# Patient Record
Sex: Male | Born: 1960 | Race: White | Hispanic: No | Marital: Single | Smoking: Current every day smoker
Health system: Southern US, Community
[De-identification: ages and names within clinical notes are randomized; demographics above are authoritative.]

## PROBLEM LIST (undated history)

## (undated) DIAGNOSIS — T79A22A Traumatic compartment syndrome of left lower extremity, initial encounter: Secondary | ICD-10-CM

## (undated) DIAGNOSIS — I80209 Phlebitis and thrombophlebitis of unspecified deep vessels of unspecified lower extremity: Secondary | ICD-10-CM

## (undated) HISTORY — PX: FASCIOTOMY: SHX132

## (undated) HISTORY — PX: TONSILLECTOMY: SUR1361

## (undated) HISTORY — PX: FOOT SURGERY: SHX648

---

## 2010-11-21 ENCOUNTER — Ambulatory Visit: Admit: 2010-11-21 | Payer: Self-pay | Admitting: Family

## 2010-11-21 DIAGNOSIS — Z0289 Encounter for other administrative examinations: Secondary | ICD-10-CM

## 2013-05-31 ENCOUNTER — Emergency Department (HOSPITAL_BASED_OUTPATIENT_CLINIC_OR_DEPARTMENT_OTHER): Payer: Medicare Other

## 2013-05-31 ENCOUNTER — Inpatient Hospital Stay (HOSPITAL_BASED_OUTPATIENT_CLINIC_OR_DEPARTMENT_OTHER)
Admission: EM | Admit: 2013-05-31 | Discharge: 2013-06-04 | DRG: 504 | Disposition: A | Discharge status: Home / Self Care | Payer: Medicare Other | Attending: Internal Medicine | Admitting: Internal Medicine

## 2013-05-31 ENCOUNTER — Encounter (HOSPITAL_BASED_OUTPATIENT_CLINIC_OR_DEPARTMENT_OTHER): Payer: Self-pay | Admitting: Emergency Medicine

## 2013-05-31 DIAGNOSIS — L03119 Cellulitis of unspecified part of limb: Secondary | ICD-10-CM | POA: Diagnosis present

## 2013-05-31 DIAGNOSIS — D72829 Elevated white blood cell count, unspecified: Secondary | ICD-10-CM | POA: Diagnosis present

## 2013-05-31 DIAGNOSIS — G5790 Unspecified mononeuropathy of unspecified lower limb: Secondary | ICD-10-CM | POA: Diagnosis present

## 2013-05-31 DIAGNOSIS — D72819 Decreased white blood cell count, unspecified: Secondary | ICD-10-CM

## 2013-05-31 DIAGNOSIS — M205X9 Other deformities of toe(s) (acquired), unspecified foot: Secondary | ICD-10-CM | POA: Diagnosis present

## 2013-05-31 DIAGNOSIS — L84 Corns and callosities: Secondary | ICD-10-CM | POA: Diagnosis present

## 2013-05-31 DIAGNOSIS — M86172 Other acute osteomyelitis, left ankle and foot: Secondary | ICD-10-CM | POA: Diagnosis present

## 2013-05-31 DIAGNOSIS — L97509 Non-pressure chronic ulcer of other part of unspecified foot with unspecified severity: Secondary | ICD-10-CM | POA: Diagnosis present

## 2013-05-31 DIAGNOSIS — D473 Essential (hemorrhagic) thrombocythemia: Secondary | ICD-10-CM | POA: Diagnosis present

## 2013-05-31 DIAGNOSIS — L02619 Cutaneous abscess of unspecified foot: Secondary | ICD-10-CM | POA: Diagnosis present

## 2013-05-31 DIAGNOSIS — Z79899 Other long term (current) drug therapy: Secondary | ICD-10-CM

## 2013-05-31 DIAGNOSIS — Z86718 Personal history of other venous thrombosis and embolism: Secondary | ICD-10-CM

## 2013-05-31 DIAGNOSIS — M86179 Other acute osteomyelitis, unspecified ankle and foot: Principal | ICD-10-CM | POA: Diagnosis present

## 2013-05-31 DIAGNOSIS — F17200 Nicotine dependence, unspecified, uncomplicated: Secondary | ICD-10-CM | POA: Diagnosis present

## 2013-05-31 DIAGNOSIS — D75839 Thrombocytosis, unspecified: Secondary | ICD-10-CM

## 2013-05-31 HISTORY — DX: Traumatic compartment syndrome of left lower extremity, initial encounter: T79.A22A

## 2013-05-31 HISTORY — DX: Phlebitis and thrombophlebitis of unspecified deep vessels of unspecified lower extremity: I80.209

## 2013-05-31 LAB — CBC WITH DIFFERENTIAL/PLATELET
Basophils Relative: 0 % (ref 0–1)
Eosinophils Absolute: 0.4 10*3/uL (ref 0.0–0.7)
Eosinophils Relative: 2 % (ref 0–5)
Hemoglobin: 13.2 g/dL (ref 13.0–17.0)
Lymphs Abs: 3.5 10*3/uL (ref 0.7–4.0)
MCH: 28.5 pg (ref 26.0–34.0)
MCHC: 33.3 g/dL (ref 30.0–36.0)
MCV: 85.5 fL (ref 78.0–100.0)
Monocytes Absolute: 1.2 10*3/uL — ABNORMAL HIGH (ref 0.1–1.0)
Monocytes Relative: 7 % (ref 3–12)
Neutrophils Relative %: 68 % (ref 43–77)
RBC: 4.63 MIL/uL (ref 4.22–5.81)

## 2013-05-31 LAB — BASIC METABOLIC PANEL
BUN: 18 mg/dL (ref 6–23)
Calcium: 9.1 mg/dL (ref 8.4–10.5)
Creatinine, Ser: 1 mg/dL (ref 0.50–1.35)
GFR calc Af Amer: >90 mL/min (ref >90)
GFR calc non Af Amer: 86 mL/min — ABNORMAL LOW (ref >90)
Glucose, Bld: 88 mg/dL (ref 70–99)
Potassium: 4.6 mEq/L (ref 3.5–5.1)

## 2013-05-31 MED ORDER — SODIUM CHLORIDE 0.9 % IV SOLN: INTRAVENOUS | Status: DC

## 2013-05-31 MED ORDER — HYDROCODONE-ACETAMINOPHEN 5-325 MG PO TABS
1.0000 | ORAL_TABLET | Freq: Once | ORAL | Status: AC
  Administered 2013-05-31: 1 via ORAL
  Filled 2013-05-31: qty 1

## 2013-05-31 MED ORDER — SODIUM CHLORIDE 0.9 % IV SOLN
INTRAVENOUS | Status: DC
  Administered 2013-06-01: 1000 mL via INTRAVENOUS
  Administered 2013-06-02: 75 mL/h via INTRAVENOUS
  Administered 2013-06-02: 05:00:00 via INTRAVENOUS
  Administered 2013-06-03: 1000 mL via INTRAVENOUS
  Administered 2013-06-04: 01:00:00 via INTRAVENOUS

## 2013-05-31 MED ORDER — HYDROMORPHONE HCL PF 1 MG/ML IJ SOLN
1.0000 mg | INTRAMUSCULAR | Status: AC | PRN
  Administered 2013-05-31 – 2013-06-01 (×6): 1 mg via INTRAVENOUS
  Filled 2013-05-31 (×6): qty 1

## 2013-05-31 MED ORDER — SODIUM CHLORIDE 0.9 % IV BOLUS (SEPSIS)
1000.0000 mL | Freq: Once | INTRAVENOUS | Status: AC
  Administered 2013-05-31: 1000 mL via INTRAVENOUS

## 2013-05-31 MED ORDER — HYDROMORPHONE HCL PF 1 MG/ML IJ SOLN
1.0000 mg | Freq: Once | INTRAMUSCULAR | Status: AC
  Administered 2013-05-31: 1 mg via INTRAVENOUS

## 2013-05-31 MED ORDER — HYDROMORPHONE HCL PF 1 MG/ML IJ SOLN: 1.0000 mg | INTRAMUSCULAR | Status: DC | PRN

## 2013-05-31 MED ORDER — VANCOMYCIN HCL IN DEXTROSE 1-5 GM/200ML-% IV SOLN
1000.0000 mg | Freq: Once | INTRAVENOUS | Status: AC
  Administered 2013-05-31: 1000 mg via INTRAVENOUS
  Filled 2013-05-31: qty 200

## 2013-05-31 MED ORDER — VANCOMYCIN HCL IN DEXTROSE 1-5 GM/200ML-% IV SOLN
1000.0000 mg | Freq: Three times a day (TID) | INTRAVENOUS | Status: DC
  Administered 2013-05-31 – 2013-06-03 (×9): 1000 mg via INTRAVENOUS
  Filled 2013-05-31 (×9): qty 200

## 2013-05-31 MED ORDER — HYDROMORPHONE HCL PF 1 MG/ML IJ SOLN
INTRAMUSCULAR | Status: AC
  Filled 2013-05-31: qty 1

## 2013-05-31 MED ORDER — PNEUMOCOCCAL VAC POLYVALENT 25 MCG/0.5ML IJ INJ
0.5000 mL | INJECTION | INTRAMUSCULAR | Status: AC
  Administered 2013-06-01: 0.5 mL via INTRAMUSCULAR
  Filled 2013-05-31 (×2): qty 0.5

## 2013-05-31 MED ORDER — HYDROMORPHONE HCL PF 1 MG/ML IJ SOLN
1.0000 mg | Freq: Once | INTRAMUSCULAR | Status: AC
  Administered 2013-05-31: 1 mg via INTRAVENOUS
  Filled 2013-05-31: qty 1

## 2013-05-31 NOTE — Progress Notes (Signed)
ANTIBIOTIC CONSULT NOTE - INITIAL  Pharmacy Consult for vancomycin Indication: osteomyelitis of left foot    No Known Allergies  Patient Measurements: Height: 6\' 2"  (188 cm) Weight: 198 lb 6.6 oz (90 kg) IBW/kg (Calculated) : 82.2 Adjusted Body Weight:   Vital Signs: Temp: 98.2 F (36.8 C) (07/27 1950) Temp src: Oral (07/27 1950) BP: 131/67 mmHg (07/27 1950) Pulse Rate: 84 (07/27 1950) Intake/Output from previous day:   Intake/Output from this shift:    Labs:  Recent Labs  05/31/13 1500  WBC 15.9*  HGB 13.2  PLT 455*  CREATININE 1.00   Estimated Creatinine Clearance: 102.8 ml/min (by C-G formula based on Cr of 1). No results found for this basename: VANCOTROUGH, VANCOPEAK, VANCORANDOM, GENTTROUGH, GENTPEAK, GENTRANDOM, TOBRATROUGH, TOBRAPEAK, TOBRARND, AMIKACINPEAK, AMIKACINTROU, AMIKACIN,  in the last 72 hours   Microbiology: No results found for this or any previous visit (from the past 720 hour(s)).  Medical History: Past Medical History  Diagnosis Date  . Phlegmasia cerulea dolens   . Compartment syndrome of left lower extremity     Medications:  Anti-infectives   Start     Dose/Rate Route Frequency Ordered Stop   05/31/13 2215  vancomycin (VANCOCIN) IVPB 1000 mg/200 mL premix     1,000 mg 200 mL/hr over 60 Minutes Intravenous 3 times per day 05/31/13 2201     05/31/13 1445  vancomycin (VANCOCIN) IVPB 1000 mg/200 mL premix     1,000 mg 200 mL/hr over 60 Minutes Intravenous  Once 05/31/13 1443 05/31/13 1618     Assessment: Patient with osteomyelitis of left foot.  First dose of antibiotics already given at ~1500.  Goal of Therapy:  Vancomycin trough level 15-20 mcg/ml  Plan:  Measure antibiotic drug levels at steady state Follow up culture results Vancomycin 1gm iv q8hr  Scott Frye 05/31/2013,10:03 PM

## 2013-05-31 NOTE — ED Notes (Signed)
[facility] here for transport.  

## 2013-05-31 NOTE — ED Notes (Signed)
Patient transported to X-ray 

## 2013-05-31 NOTE — Consult Note (Signed)
Reason for Consult: Left foot non-healing ulcer Referring Physician: Dr. Ina Kick is an 52 y.o. male.  HPI: Scott Frye is a 52 yo male with a several year history of problems related to his left foot. It all began with a blood clot in his left calf which led to severe swelling in the leg and development of compartment syndrome requiring fasciotomy. He developed clawing in all lesser toes and has had blood blisters on the dorsum of the 2nd and third toes on a few occasions. He also developed an ulcer under the 5th metatarsal head which was treated with Irrigation and debridement by Dr. Yetta Barre in [place]. Scott Frye states that the wound did not heal and that he had 6 weeks of IV antibiotics as well as a VAC. He has had a persistent non-healing ulcer in this area with increasing drainage and pain recently. 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 He presented to the [facility] today and evaluation showed radiographs consistent with osteomyelitis of the 5th metatarsal. He is subsequently admitted here for treatment and I was consulted for possible surgical intervention.  History reviewed. No pertinent past medical history.  Past Surgical History  Procedure Laterality Date  . Foot surgery    . Fasciotomy      left leg  . Tonsillectomy      No family history on file.  Social History:  reports that he has been smoking.  He does not have any smokeless tobacco history on file. He reports that he does not drink alcohol or use illicit drugs.  Allergies: No Known Allergies  Medications: I have reviewed the patient's current medications.  Results for orders placed during the hospital encounter of 05/31/13 (from the past 48 hour(s))  CBC WITH DIFFERENTIAL     Status: Abnormal   Collection Time    05/31/13  3:00 PM      Result Value Range   WBC 15.9  (*) 4.0 - 10.5 K/uL   RBC 4.63  4.22 - 5.81 MIL/uL   Hemoglobin 13.2  13.0 - 17.0 g/dL   HCT 64.4  03.4 - 74.2 %   MCV 85.5  78.0 - 100.0 fL   MCH 28.5  26.0 - 34.0 pg   MCHC 33.3  30.0 - 36.0 g/dL   RDW 59.5  63.8 - 75.6 %   Platelets 455 (*) 150 - 400 K/uL   Neutrophils Relative % 68  43 - 77 %   Neutro Abs 10.8 (*) 1.7 - 7.7 K/uL   Lymphocytes Relative 22  12 - 46 %   Lymphs Abs 3.5  0.7 - 4.0 K/uL   Monocytes Relative 7  3 - 12 %   Monocytes Absolute 1.2 (*) 0.1 - 1.0 K/uL   Eosinophils Relative 2  0 - 5 %   Eosinophils Absolute 0.4  0.0 - 0.7 K/uL   Basophils Relative 0  0 - 1 %   Basophils Absolute 0.0  0.0 - 0.1 K/uL  BASIC METABOLIC PANEL     Status: Abnormal   Collection Time    05/31/13  3:00 PM      Result Value Range   Sodium 137  135 - 145 mEq/L   Potassium 4.6  3.5 - 5.1 mEq/L   Chloride 101  96 - 112 mEq/L   CO2 25  19 - 32  mEq/L   Glucose, Bld 88  70 - 99 mg/dL   BUN 18  6 - 23 mg/dL   Creatinine, Ser 4.54  0.50 - 1.35 mg/dL   Calcium 9.1  8.4 - 09.8 mg/dL   GFR calc non Af Amer 86 (*) >90 mL/min   GFR calc Af Amer >90  >90 mL/min   Comment:            The eGFR has been calculated     using the CKD EPI equation.     This calculation has not been     validated in all clinical     situations.     eGFR's persistently     <90 mL/min signify     possible Chronic Kidney Disease.    Dg Foot Complete Left  05/31/2013   *RADIOLOGY REPORT*  Clinical Data: Nonhealing wound in the lateral aspect of the left foot and beneath the base of the fifth toe.  LEFT FOOT - COMPLETE 3+ VIEW  Comparison: No priors.  Findings: There is soft tissue gas within the plantar aspect of the foot beneath the fifth MTP joint.  There is osteolysis of the head of the fifth metatarsal, and there is dorsal dislocation of the digit beyond this level.  No acute displaced fracture is noted. Degenerative changes of osteoarthritis are noted, most severe at the first MTP joint.  IMPRESSION: 1.  The  appearance of the foot is compatible with osteomyelitis of the head of the fifth metatarsal with overlying soft tissue ulcer and dislocation of the fifth MTP joint, as above.   Original Report Authenticated By: Trudie Reed, M.D.    Review of Systems  Constitutional: Negative.   HENT: Negative.   Eyes: Negative.   Respiratory: Negative.   Cardiovascular: Negative.   Gastrointestinal: Negative.   Genitourinary: Negative.   Musculoskeletal: Negative.   Skin:       Chronic ulceration left 5th toe at lateral base of toe  Endo/Heme/Allergies: Negative.   Psychiatric/Behavioral: Negative.    Blood pressure 131/67, pulse 84, temperature 98.2 F (36.8 C), temperature source Oral, resp. rate 18, height 6\' 2"  (1.88 m), weight 198 lb 6.6 oz (90 kg), SpO2 98.00%. 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 Has scant amount of malodorous drainage. No surrounding erythema   Assessment/Plan: Left foot non-healing ulcer with osteomyelitis- This has been a chronic non-healing ulcer with chronic destructive changes of the 5th metatarsal head. Due to his claw toes he has added pressure on the metatarsal heads which has led to chronic wound breakdown and now osteomyelitis. With his worsening pain and drainage, I would recommend formal Irrigation and Debridement with partial amputation of the 5th metatarsal to allow for primary wound closure. I have  discussed this in detail with the patient and his wife and they would like to proceed in surgical intervention. Will proceed tomorrow afternoon.  Loanne Drilling 05/31/2013, 8:32 PM

## 2013-05-31 NOTE — ED Notes (Signed)
MD at bedside. 

## 2013-05-31 NOTE — ED Notes (Signed)
Pt reports left lateral and heel foot pain x 3 days that progressively become worse. Sts has appt with foot doctor on Aug 4th.

## 2013-05-31 NOTE — H&P (Signed)
[facility] History and Physical  Scott Frye ZOX:[id number] DOB: 23-Aug-1961 DOA: 05/31/2013  Referring physician: ED PCP: No primary provider on file.  Chief Complaint: Left foot pain and ulcer  HPI: Scott Frye is a 52 y.o. male who presents to to the ED at [facility] with c/o L foot chronic ulcer that has become significantly more painful recently.  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  He developed clawwing in all of his lesser toes.  He states that the wound did not heal on his foot and he had 6 weeks of ABx as well as a wound VAC at that time.  He has had a persistent non-healing ulcer on the plantar surface of his foot since that time with increasing drainage and pain recently.  In the ED at [facility] he was running a WBC of 15k, X ray of his foot was worrisome for osteomyelitis.  He was transferred to [facility] for admission and treatment.  Review of Systems: No chest pain, no SOB, no DOE, exercises frequently and is in quite good shape, 12 systems reviewed and otherwise negative.  History reviewed. No pertinent past medical history. Past Surgical History  Procedure Laterality Date  . Foot surgery    . Fasciotomy      left leg  . Tonsillectomy     Social History:  reports that he has been smoking.  He does not have any smokeless tobacco history on file. He reports that he does not drink alcohol or use illicit drugs.   No Known Allergies  No family history on file.  Prior to Admission medications   Medication Sig Start Date End Date Taking? Authorizing Provider  acetaminophen (TYLENOL) 500 MG tablet Take 1,000 mg by mouth every 6 (six) hours as needed for pain.   Yes Historical Provider, MD  gabapentin (NEURONTIN) 600 MG tablet Take 1,800 mg by mouth 3 (three) times daily as needed (pain).   Yes Historical Provider, MD  Multiple  Vitamin (MULTIVITAMIN WITH MINERALS) TABS Take 1 tablet by mouth every morning.   Yes Historical Provider, MD   Physical Exam: Filed Vitals:   05/31/13 1605 05/31/13 1725 05/31/13 1754 05/31/13 1950  BP: 116/70  126/63 131/67  Pulse: 84  92 84  Temp:  98.1 F (36.7 C)  98.2 F (36.8 C)  TempSrc:  Oral  Oral  Resp: 16   18  Height:    6\' 2"  (1.88 m)  Weight:    90 kg (198 lb 6.6 oz)  SpO2: 100%  98% 98%    General:  NAD, resting comfortably in bed Eyes: PEERLA EOMI ENT: mucous membranes moist Neck: supple w/o JVD Cardiovascular: RRR w/o MRG Respiratory: CTA B Abdomen: soft, nt, nd, bs+ Skin: chronic ulceration of the left 5th toe and lateral base of toe, clawing of all toes of L foot Musculoskeletal: MAE, full ROM all 4 extremities Psychiatric: normal tone and affect Neurologic: AAOx3, grossly non-focal  Labs on Admission:  Basic Metabolic Panel:  Recent Labs Lab 05/31/13 1500  NA 137  K 4.6  CL 101  CO2 25  GLUCOSE 88  BUN 18  CREATININE 1.00  CALCIUM 9.1   Liver Function Tests: No results found for this basename: AST, ALT, ALKPHOS, BILITOT, PROT, ALBUMIN,  in the last 168 hours No results found for this basename: LIPASE, AMYLASE,  in the last  168 hours No results found for this basename: AMMONIA,  in the last 168 hours CBC:  Recent Labs Lab 05/31/13 1500  WBC 15.9*  NEUTROABS 10.8*  HGB 13.2  HCT 39.6  MCV 85.5  PLT 455*   Cardiac Enzymes: No results found for this basename: CKTOTAL, CKMB, CKMBINDEX, TROPONINI,  in the last 168 hours  BNP (last 3 results) No results found for this basename: PROBNP,  in the last 8760 hours CBG: No results found for this basename: GLUCAP,  in the last 168 hours  Radiological Exams on Admission: Dg Foot Complete Left  05/31/2013   *RADIOLOGY REPORT*  Clinical Data: Nonhealing wound in the lateral aspect of the left foot and beneath the base of the fifth toe.  LEFT FOOT - COMPLETE 3+ VIEW  Comparison: No priors.   Findings: There is soft tissue gas within the plantar aspect of the foot beneath the fifth MTP joint.  There is osteolysis of the head of the fifth metatarsal, and there is dorsal dislocation of the digit beyond this level.  No acute displaced fracture is noted. Degenerative changes of osteoarthritis are noted, most severe at the first MTP joint.  IMPRESSION: 1.  The appearance of the foot is compatible with osteomyelitis of the head of the fifth metatarsal with overlying soft tissue ulcer and dislocation of the fifth MTP joint, as above.   Original Report Authenticated By: Trudie Reed, M.D.    EKG: Independently reviewed.  Assessment/Plan Principal Problem:   Acute osteomyelitis of left foot   1.  Acute osteomyelitis of left foot - secondary to non-healing ulcer from his bout of phlegmasia cerulea dolens occuring several years back, continuing vancomycin on patient, surgery planned for tomorrow, final ABx recommendations based on bone culture findings.  Patient NPO after midnight for surgery, keeping him on 75 cc/hr of NS to ensure he stays hydrated.  Dr. Lequita Halt planning surgery tomorrow, please see his note.  Code Status: Full Code (must indicate code status--if unknown or must be presumed, indicate so) Family Communication: Spoke with wife at bedside (indicate person spoken with, if applicable, with phone number if by telephone) Disposition Plan: Admit to inpatient (indicate anticipated LOS)  Time spent: 70 min  Magdiel Bartles M. [facility] Pager [phone]  If 7PM-7AM, please contact night-coverage [email or web address] Password [email or web address] 05/31/2013, 9:14 PM

## 2013-05-31 NOTE — ED Provider Notes (Addendum)
CSN: [id number]     Arrival date & time 05/31/13  1344 History     First MD Initiated Contact with Patient 05/31/13 1346     Chief Complaint  Patient presents with  . Foot Pain   (Consider location/radiation/quality/duration/timing/severity/associated sxs/prior Treatment) HPI  The patient presents with concern of left foot pain.  This episode began 3 days ago.  Patient's notable history of left distal lower extremity DVT that required fasciotomy 3 years ago.  Since that time he has had chronic pain in the left distal extremity, as well as a nonhealing ulcer on the inferior aspect of his fifth digit.  He states that this new pain it is distinct from his typical pain.  The pain is severe, sharp, worse with angulation or weight-bearing.  Pain is not improved with Neurontin.  There is no new discharge, fever, chills, other focal complaints.    History reviewed. No pertinent past medical history. Past Surgical History  Procedure Laterality Date  . Foot surgery    . Fasciotomy      left leg  . Tonsillectomy     No family history on file. History  Substance Use Topics  . Smoking status: Current Every Day Smoker  . Smokeless tobacco: Not on file  . Alcohol Use: No    Review of Systems  All other systems reviewed and are negative.    Allergies  Review of patient's allergies indicates no known allergies.  Home Medications  No current outpatient prescriptions on file. BP 145/84  Pulse 93  Resp 18  SpO2 99% Physical Exam  Nursing note and vitals reviewed. Constitutional: He is oriented to person, place, and time. He appears well-developed. No distress.  HENT:  Head: Normocephalic and atraumatic.  Eyes: Conjunctivae and EOM are normal.  Cardiovascular: Normal rate, regular rhythm and intact distal pulses.   Pulmonary/Chest: Effort normal. No stridor. No respiratory distress.  Abdominal: He exhibits no distension.  Musculoskeletal: He exhibits no edema.  Patient has a  fasciotomy scar on the lateral aspect of the midcalf.  Distally, the patient is no tenderness or deformity about the ankle.  There is tenderness to palpation about the distal inferior he'll without deformity. The status of the left foot are all curled, held in flexion. At the base of the proximal phalanx of the fifth digit there is a nonhealing wound approximately 1 cm diameter with surrounding inflammatory tissue, but no discharge, no bleeding, no necrosis. This area is tender to palpation. Patient can flex and extend all toes minimally, states that this is unchanged from his new baseline.   Neurological: He is alert and oriented to person, place, and time.  Skin: Skin is warm and dry.  Psychiatric: He has a normal mood and affect.    ED Course   Procedures (including critical care time)  Labs Reviewed - No data to display No results found. No diagnosis found.   2:49 PM On repeat exam the patient appears comfortable, but describes ongoing pain in the foot.  IV fluids are started, analgesia started. I have interpreted the x-ray, results is inconsistent with physical exam.  On repeat exam the toe is misaligned, and the patient states that it has been this way for some time MDM  This generally well patient presents with ongoing concerns of left lateral foot pain.  Notably, his history of prior DVT, with subsequent increase in pain, decrease in functionality is concerning.  Patient does have a nonhealing ulcer, and given this with his description of  the pain x-ray was performed to rule out osteomyelitis.  This was demonstrated on x-ray.  The patient required admission following initiation of antibiotics for further evaluation and management. The patient's misaligned toe seems chronic, and also will require further evaluation and management by our orthopedist.   Gerhard Munch, MD 05/31/13 1451  2:58 PM Dr. Lequita Halt will consult on the patient's case.  The patient will be transferred to  [facility].   Gerhard Munch, MD 05/31/13 1459

## 2013-06-01 ENCOUNTER — Encounter (HOSPITAL_COMMUNITY): Payer: Self-pay | Admitting: Anesthesiology

## 2013-06-01 ENCOUNTER — Encounter (HOSPITAL_COMMUNITY)
Admission: EM | Disposition: A | Discharge status: Home / Self Care | Payer: Self-pay | Source: Home / Self Care | Attending: Internal Medicine

## 2013-06-01 ENCOUNTER — Inpatient Hospital Stay (HOSPITAL_COMMUNITY): Payer: Medicare Other | Admitting: Anesthesiology

## 2013-06-01 HISTORY — PX: AMPUTATION: SHX166

## 2013-06-01 LAB — BASIC METABOLIC PANEL
BUN: 16 mg/dL (ref 6–23)
Chloride: 102 mEq/L (ref 96–112)
Creatinine, Ser: 0.91 mg/dL (ref 0.50–1.35)
GFR calc Af Amer: >90 mL/min (ref >90)
GFR calc non Af Amer: >90 mL/min (ref >90)

## 2013-06-01 LAB — CBC
HCT: 39.8 % (ref 39.0–52.0)
MCHC: 32.7 g/dL (ref 30.0–36.0)
MCV: 85.6 fL (ref 78.0–100.0)
Platelets: 426 10*3/uL — ABNORMAL HIGH (ref 150–400)
RDW: 14.3 % (ref 11.5–15.5)
WBC: 14.2 10*3/uL — ABNORMAL HIGH (ref 4.0–10.5)

## 2013-06-01 LAB — SURGICAL PCR SCREEN: MRSA, PCR: NEGATIVE

## 2013-06-01 SURGERY — AMPUTATION DIGIT
Anesthesia: General | Site: Foot | Laterality: Left | Wound class: Clean Contaminated

## 2013-06-01 MED ORDER — GABAPENTIN 600 MG PO TABS
1800.0000 mg | ORAL_TABLET | Freq: Three times a day (TID) | ORAL | Status: DC | PRN
  Filled 2013-06-01: qty 3

## 2013-06-01 MED ORDER — METOCLOPRAMIDE HCL 5 MG/ML IJ SOLN: 5.0000 mg | Freq: Three times a day (TID) | INTRAMUSCULAR | Status: DC | PRN

## 2013-06-01 MED ORDER — OXYCODONE HCL 5 MG/5ML PO SOLN
5.0000 mg | Freq: Once | ORAL | Status: DC | PRN
  Filled 2013-06-01: qty 5

## 2013-06-01 MED ORDER — MEPERIDINE HCL 50 MG/ML IJ SOLN: 6.2500 mg | INTRAMUSCULAR | Status: DC | PRN

## 2013-06-01 MED ORDER — HYDROMORPHONE HCL PF 1 MG/ML IJ SOLN
INTRAMUSCULAR | Status: DC | PRN
  Administered 2013-06-01 (×2): 1 mg via INTRAVENOUS

## 2013-06-01 MED ORDER — PROMETHAZINE HCL 25 MG/ML IJ SOLN: 6.2500 mg | INTRAMUSCULAR | Status: DC | PRN

## 2013-06-01 MED ORDER — ONDANSETRON HCL 4 MG PO TABS: 4.0000 mg | ORAL_TABLET | Freq: Four times a day (QID) | ORAL | Status: DC | PRN

## 2013-06-01 MED ORDER — ONDANSETRON HCL 4 MG/2ML IJ SOLN
INTRAMUSCULAR | Status: DC | PRN
  Administered 2013-06-01: 4 mg via INTRAVENOUS

## 2013-06-01 MED ORDER — METHOCARBAMOL 500 MG PO TABS
500.0000 mg | ORAL_TABLET | Freq: Four times a day (QID) | ORAL | Status: DC | PRN
  Administered 2013-06-02 (×2): 500 mg via ORAL
  Filled 2013-06-01 (×2): qty 1

## 2013-06-01 MED ORDER — ADULT MULTIVITAMIN W/MINERALS CH
1.0000 | ORAL_TABLET | Freq: Every morning | ORAL | Status: DC
  Administered 2013-06-01: 1 via ORAL
  Filled 2013-06-01 (×2): qty 1

## 2013-06-01 MED ORDER — ENOXAPARIN SODIUM 40 MG/0.4ML ~~LOC~~ SOLN
40.0000 mg | SUBCUTANEOUS | Status: DC
  Administered 2013-06-02 – 2013-06-03 (×2): 40 mg via SUBCUTANEOUS
  Filled 2013-06-01 (×4): qty 0.4

## 2013-06-01 MED ORDER — METHOCARBAMOL 100 MG/ML IJ SOLN
500.0000 mg | Freq: Four times a day (QID) | INTRAVENOUS | Status: DC | PRN
  Administered 2013-06-01: 500 mg via INTRAVENOUS
  Filled 2013-06-01: qty 5

## 2013-06-01 MED ORDER — EPHEDRINE SULFATE 50 MG/ML IJ SOLN
INTRAMUSCULAR | Status: DC | PRN
  Administered 2013-06-01: 5 mg via INTRAVENOUS

## 2013-06-01 MED ORDER — LACTATED RINGERS IV SOLN
INTRAVENOUS | Status: DC | PRN
  Administered 2013-06-01 (×2): via INTRAVENOUS
  Administered 2013-06-01: 17:00:00

## 2013-06-01 MED ORDER — KETOROLAC TROMETHAMINE 30 MG/ML IJ SOLN
INTRAMUSCULAR | Status: AC
  Filled 2013-06-01: qty 1

## 2013-06-01 MED ORDER — FENTANYL CITRATE 0.05 MG/ML IJ SOLN
INTRAMUSCULAR | Status: DC | PRN
  Administered 2013-06-01 (×2): 50 ug via INTRAVENOUS

## 2013-06-01 MED ORDER — HYDROMORPHONE HCL PF 1 MG/ML IJ SOLN
1.0000 mg | Freq: Once | INTRAMUSCULAR | Status: AC
  Administered 2013-06-01: 1 mg via INTRAVENOUS
  Filled 2013-06-01: qty 1

## 2013-06-01 MED ORDER — ACETAMINOPHEN 10 MG/ML IV SOLN
1000.0000 mg | Freq: Once | INTRAVENOUS | Status: AC | PRN
  Administered 2013-06-01: 1000 mg via INTRAVENOUS
  Filled 2013-06-01: qty 100

## 2013-06-01 MED ORDER — LOPERAMIDE HCL 2 MG PO CAPS: 4.0000 mg | ORAL_CAPSULE | Freq: Once | ORAL | Status: DC

## 2013-06-01 MED ORDER — PROPOFOL 10 MG/ML IV BOLUS
INTRAVENOUS | Status: DC | PRN
  Administered 2013-06-01: 50 mg via INTRAVENOUS
  Administered 2013-06-01: 200 mg via INTRAVENOUS

## 2013-06-01 MED ORDER — HYDROMORPHONE HCL PF 1 MG/ML IJ SOLN
INTRAMUSCULAR | Status: AC
  Administered 2013-06-02: 1 mg via INTRAVENOUS
  Filled 2013-06-01: qty 1

## 2013-06-01 MED ORDER — HYDROMORPHONE HCL PF 1 MG/ML IJ SOLN
0.2500 mg | INTRAMUSCULAR | Status: DC | PRN
  Administered 2013-06-01 (×4): 0.5 mg via INTRAVENOUS

## 2013-06-01 MED ORDER — OXYCODONE HCL 5 MG PO TABS
5.0000 mg | ORAL_TABLET | ORAL | Status: DC | PRN
  Administered 2013-06-01 – 2013-06-02 (×5): 5 mg via ORAL
  Filled 2013-06-01 (×5): qty 1

## 2013-06-01 MED ORDER — KETOROLAC TROMETHAMINE 30 MG/ML IJ SOLN
30.0000 mg | Freq: Once | INTRAMUSCULAR | Status: AC
  Administered 2013-06-01: 30 mg via INTRAVENOUS

## 2013-06-01 MED ORDER — ONDANSETRON HCL 4 MG/2ML IJ SOLN: 4.0000 mg | Freq: Four times a day (QID) | INTRAMUSCULAR | Status: DC | PRN

## 2013-06-01 MED ORDER — HYDROMORPHONE HCL PF 1 MG/ML IJ SOLN
0.5000 mg | INTRAMUSCULAR | Status: DC | PRN
  Administered 2013-06-01: 0.5 mg via INTRAVENOUS

## 2013-06-01 MED ORDER — MIDAZOLAM HCL 5 MG/5ML IJ SOLN
INTRAMUSCULAR | Status: DC | PRN
  Administered 2013-06-01: 2 mg via INTRAVENOUS

## 2013-06-01 MED ORDER — METOCLOPRAMIDE HCL 10 MG PO TABS: 5.0000 mg | ORAL_TABLET | Freq: Three times a day (TID) | ORAL | Status: DC | PRN

## 2013-06-01 MED ORDER — PHENYLEPHRINE HCL 10 MG/ML IJ SOLN
INTRAMUSCULAR | Status: DC | PRN
  Administered 2013-06-01: 40 ug via INTRAVENOUS
  Administered 2013-06-01: 80 ug via INTRAVENOUS
  Administered 2013-06-01: 40 ug via INTRAVENOUS

## 2013-06-01 MED ORDER — LIDOCAINE HCL 1 % IJ SOLN
INTRAMUSCULAR | Status: DC | PRN
  Administered 2013-06-01: 80 mg via INTRADERMAL

## 2013-06-01 MED ORDER — OXYCODONE HCL 5 MG PO TABS: 5.0000 mg | ORAL_TABLET | Freq: Once | ORAL | Status: DC | PRN

## 2013-06-01 SURGICAL SUPPLY — 33 items
BAG ZIPLOCK 12X15 (MISCELLANEOUS) ×2 IMPLANT
BANDAGE ELASTIC 4 VELCRO ST LF (GAUZE/BANDAGES/DRESSINGS) ×2 IMPLANT
BANDAGE ELASTIC 6 VELCRO ST LF (GAUZE/BANDAGES/DRESSINGS) ×2 IMPLANT
BANDAGE GAUZE ELAST BULKY 4 IN (GAUZE/BANDAGES/DRESSINGS) ×4 IMPLANT
BLADE MIC 41X13 (BLADE) IMPLANT
BLADE OSCILLATING/SAGITTAL (BLADE) ×1
BLADE SAW SGTL 81X20 HD (BLADE) ×2 IMPLANT
BLADE SURG SZ10 CARB STEEL (BLADE) IMPLANT
BLADE SW THK.38XMED LNG THN (BLADE) ×1 IMPLANT
CLOTH BEACON ORANGE TIMEOUT ST (SAFETY) ×2 IMPLANT
DRAIN PENROSE 18X1/2 LTX STRL (DRAIN) ×2 IMPLANT
DRAIN PENROSE 18X1/4 LTX STRL (WOUND CARE) IMPLANT
DRSG EMULSION OIL 3X16 NADH (GAUZE/BANDAGES/DRESSINGS) ×2 IMPLANT
DRSG PAD ABDOMINAL 8X10 ST (GAUZE/BANDAGES/DRESSINGS) ×2 IMPLANT
DURAPREP 26ML APPLICATOR (WOUND CARE) ×2 IMPLANT
ELECT REM PT RETURN 9FT ADLT (ELECTROSURGICAL) ×2
ELECTRODE REM PT RTRN 9FT ADLT (ELECTROSURGICAL) ×1 IMPLANT
GLOVE BIO SURGEON STRL SZ7.5 (GLOVE) ×2 IMPLANT
GLOVE BIO SURGEON STRL SZ8 (GLOVE) ×4 IMPLANT
GOWN STRL NON-REIN LRG LVL3 (GOWN DISPOSABLE) ×2 IMPLANT
GOWN STRL REIN XL XLG (GOWN DISPOSABLE) ×2 IMPLANT
KIT BASIN OR (CUSTOM PROCEDURE TRAY) ×2 IMPLANT
MANIFOLD NEPTUNE II (INSTRUMENTS) ×2 IMPLANT
NS IRRIG 1000ML POUR BTL (IV SOLUTION) ×2 IMPLANT
PACK LOWER EXTREMITY WL (CUSTOM PROCEDURE TRAY) ×2 IMPLANT
POSITIONER SURGICAL ARM (MISCELLANEOUS) ×2 IMPLANT
SPONGE GAUZE 4X4 12PLY (GAUZE/BANDAGES/DRESSINGS) ×2 IMPLANT
SUT ETHILON 3 0 PS 1 (SUTURE) ×2 IMPLANT
SUT NYLON 3 0 (SUTURE) ×2 IMPLANT
SWAB COLLECTION DEVICE MRSA (MISCELLANEOUS) ×2 IMPLANT
TOWEL OR 17X26 10 PK STRL BLUE (TOWEL DISPOSABLE) ×4 IMPLANT
TUBE ANAEROBIC SPECIMEN COL (MISCELLANEOUS) ×2 IMPLANT
WATER STERILE IRR 1500ML POUR (IV SOLUTION) ×2 IMPLANT

## 2013-06-01 NOTE — Preoperative (Signed)
Beta Blockers   Reason not to administer Beta Blockers:Not Applicable 

## 2013-06-01 NOTE — Transfer of Care (Signed)
Immediate Anesthesia Transfer of Care Note  Patient: Scott Frye  Procedure(s) Performed: Procedure(s): Irrigation and debridiment left foot and partial 5th amputation (Left)  Patient Location: PACU  Anesthesia Type:General  Level of Consciousness: awake, alert  and oriented  Airway & Oxygen Therapy: Patient Spontanous Breathing and Patient connected to face mask oxygen  Post-op Assessment: Report given to PACU RN, Post -op Vital signs reviewed and stable and Patient moving all extremities X 4  Post vital signs: Reviewed and stable  Complications: No apparent anesthesia complications

## 2013-06-01 NOTE — Brief Op Note (Signed)
05/31/2013 - 06/01/2013  3:27 PM  PATIENT:  Scott Frye  52 y.o. male  PRE-OPERATIVE DIAGNOSIS:  osteomylitis left foot  POST-OPERATIVE DIAGNOSIS:  OSTEOMYLITIS LEFT FOOT   PROCEDURE:  Procedure(s): Irrigation and debridiment left foot and partial 5th amputation (Left)  SURGEON:  Surgeon(s) and Role:    * Loanne Drilling, MD - Primary  PHYSICIAN ASSISTANT:   ASSISTANTS: Avel Peace, PA-C   ANESTHESIA:   general  EBL:  Total I/O In: 1450 [I.V.:1450] Out: 1400 [Urine:1400]  BLOOD ADMINISTERED:none  DRAINS: none   LOCAL MEDICATIONS USED:  NONE  SPECIMEN:  Source of Specimen:  left 5th metatarsal head  DISPOSITION OF SPECIMEN:  PATHOLOGY  COUNTS:  YES  TOURNIQUET:   Total Tourniquet Time Documented: Thigh (Left) - 12 minutes Total: Thigh (Left) - 12 minutes   DICTATION: .Other Dictation: Dictation Number [id number]  PLAN OF CARE: Admit to inpatient   PATIENT DISPOSITION:  PACU - hemodynamically stable.

## 2013-06-01 NOTE — Anesthesia Preprocedure Evaluation (Addendum)
Anesthesia Evaluation  Patient identified by MRN, date of birth, ID band Patient awake    Reviewed: Allergy & Precautions, H&P , NPO status , Patient's Chart, lab work & pertinent test results  Airway Mallampati: I TM Distance: >3 FB Neck ROM: Full    Dental  (+) Dental Advisory Given and Teeth Intact   Pulmonary neg pulmonary ROS,  breath sounds clear to auscultation        Cardiovascular + Peripheral Vascular Disease Rhythm:Regular Rate:Normal     Neuro/Psych negative neurological ROS  negative psych ROS   GI/Hepatic negative GI ROS, Neg liver ROS,   Endo/Other  negative endocrine ROS  Renal/GU negative Renal ROS     Musculoskeletal negative musculoskeletal ROS (+)   Abdominal   Peds  Hematology   Anesthesia Other Findings   Reproductive/Obstetrics negative OB ROS                          Anesthesia Physical Anesthesia Plan  ASA: II  Anesthesia Plan: General   Post-op Pain Management:    Induction: Intravenous  Airway Management Planned: LMA  Additional Equipment:   Intra-op Plan:   Post-operative Plan: Extubation in OR  Informed Consent: I have reviewed the patients History and Physical, chart, labs and discussed the procedure including the risks, benefits and alternatives for the proposed anesthesia with the patient or authorized representative who has indicated his/her understanding and acceptance.   Dental advisory given  Plan Discussed with: CRNA  Anesthesia Plan Comments:         Anesthesia Quick Evaluation

## 2013-06-01 NOTE — Interval H&P Note (Signed)
History and Physical Interval Note:  06/01/2013 2:33 PM  Scott Frye  has presented today for surgery, with the diagnosis of osteomylitis left foot  The various methods of treatment have been discussed with the patient and family. After consideration of risks, benefits and other options for treatment, the patient has consented to  Procedure(s): Irrigation and debridiment left foot and partial 5th amputation (Left) as a surgical intervention .  The patient's history has been reviewed, patient examined, no change in status, stable for surgery.  I have reviewed the patient's chart and labs.  Questions were answered to the patient's satisfaction.     Loanne Drilling

## 2013-06-01 NOTE — Anesthesia Postprocedure Evaluation (Signed)
Anesthesia Post Note  Patient: Scott Frye  Procedure(s) Performed: Procedure(s) (LRB): Irrigation and debridiment left foot and partial 5th amputation (Left)  Anesthesia type: General  Patient location: PACU  Post pain: Pain level controlled  Post assessment: Post-op Vital signs reviewed  Last Vitals: BP 106/57  Pulse 88  Temp(Src) 36.9 C (Oral)  Resp 14  Ht 6\' 2"  (1.88 m)  Wt 198 lb 6.6 oz (90 kg)  BMI 25.46 kg/m2  SpO2 100%  Post vital signs: Reviewed  Level of consciousness: sedated  Complications: No apparent anesthesia complications

## 2013-06-01 NOTE — Progress Notes (Signed)
[facility] PROGRESS NOTE  Assessment/Plan: Acute osteomyelitis of left foot - Vanc 7.28.2014 - x-ray: soft tissue gas within the plantar aspect of the foot beneath the fifth MTP joint. - afebrile, WBC 15.1   Code Status: Full Code  Family Communication: Spoke with wife at bedside  Disposition Plan: Admit to inpatient   Consultants:  none  Procedures:  none  Antibiotics:  Vanc 7.27.2014  HPI/Subjective: No complains  Objective: Filed Vitals:   05/31/13 1725 05/31/13 1754 05/31/13 1950 06/01/13 0616  BP:  126/63 131/67 137/69  Pulse:  92 84 84  Temp: 98.1 F (36.7 C)  98.2 F (36.8 C) 98.1 F (36.7 C)  TempSrc: Oral  Oral Oral  Resp:   18 16  Height:   6\' 2"  (1.88 m)   Weight:   90 kg (198 lb 6.6 oz)   SpO2:  98% 98% 98%    Intake/Output Summary (Last 24 hours) at 06/01/13 0919 Last data filed at 06/01/13 0600  Gross per 24 hour  Intake      0 ml  Output    900 ml  Net   -900 ml   Filed Weights   05/31/13 1950  Weight: 90 kg (198 lb 6.6 oz)    Exam:  General: Alert, awake, oriented x3, in no acute distress.  HEENT: No bruits, no goiter.  Heart: Regular rate and rhythm, without murmurs, rubs, gallops.  Lungs: Good air movement, clear to auscultation Abdomen: Soft, nontender, nondistended, positive bowel sounds.  Neuro: Grossly intact, nonfocal.   Data Reviewed: Basic Metabolic Panel:  Recent Labs Lab 05/31/13 1500 06/01/13 0356  NA 137 133*  K 4.6 4.0  CL 101 102  CO2 25 24  GLUCOSE 88 96  BUN 18 16  CREATININE 1.00 0.91  CALCIUM 9.1 8.3*   Liver Function Tests: No results found for this basename: AST, ALT, ALKPHOS, BILITOT, PROT, ALBUMIN,  in the last 168 hours No results found for this basename: LIPASE, AMYLASE,  in the last 168 hours No results found for this basename: AMMONIA,  in the last 168 hours CBC:  Recent Labs Lab 05/31/13 1500 06/01/13 0356  WBC 15.9* 14.2*  NEUTROABS 10.8*  --   HGB 13.2 13.0  HCT 39.6  39.8  MCV 85.5 85.6  PLT 455* 426*   Cardiac Enzymes: No results found for this basename: CKTOTAL, CKMB, CKMBINDEX, TROPONINI,  in the last 168 hours BNP (last 3 results) No results found for this basename: PROBNP,  in the last 8760 hours CBG: No results found for this basename: GLUCAP,  in the last 168 hours  Recent Results (from the past 240 hour(s))  SURGICAL PCR SCREEN     Status: None   Collection Time    06/01/13  5:58 AM      Result Value Range Status   MRSA, PCR NEGATIVE  NEGATIVE Final   Staphylococcus aureus NEGATIVE  NEGATIVE Final   Comment:            The Xpert SA Assay (FDA     approved for NASAL specimens     in patients over 39 years of age),     is one component of     a comprehensive surveillance     program.  Test performance has     been validated by [employer] for patients greater     than or equal to 50 year old.     It is not intended  to diagnose infection nor to     guide or monitor treatment.     Studies: Dg Foot Complete Left  05/31/2013   *RADIOLOGY REPORT*  Clinical Data: Nonhealing wound in the lateral aspect of the left foot and beneath the base of the fifth toe.  LEFT FOOT - COMPLETE 3+ VIEW  Comparison: No priors.  Findings: There is soft tissue gas within the plantar aspect of the foot beneath the fifth MTP joint.  There is osteolysis of the head of the fifth metatarsal, and there is dorsal dislocation of the digit beyond this level.  No acute displaced fracture is noted. Degenerative changes of osteoarthritis are noted, most severe at the first MTP joint.  IMPRESSION: 1.  The appearance of the foot is compatible with osteomyelitis of the head of the fifth metatarsal with overlying soft tissue ulcer and dislocation of the fifth MTP joint, as above.   Original Report Authenticated By: Trudie Reed, M.D.    Scheduled Meds: . pneumococcal 23 valent vaccine  0.5 mL Intramuscular Tomorrow-1000  . vancomycin  1,000 mg Intravenous Q8H    Continuous Infusions: . sodium chloride       Marinda Elk  [facility] Pager [phone]. If 8PM-8AM, please contact night-coverage at [email or web address], password [facility] 06/01/2013, 9:19 AM  LOS: 1 day

## 2013-06-02 ENCOUNTER — Encounter (HOSPITAL_COMMUNITY): Payer: Self-pay | Admitting: Orthopedic Surgery

## 2013-06-02 MED ORDER — DOXYCYCLINE HYCLATE 100 MG PO TABS
100.0000 mg | ORAL_TABLET | Freq: Two times a day (BID) | ORAL | Status: DC
  Administered 2013-06-03 – 2013-06-04 (×3): 100 mg via ORAL
  Filled 2013-06-02 (×4): qty 1

## 2013-06-02 MED ORDER — HYDROMORPHONE HCL PF 1 MG/ML IJ SOLN
0.5000 mg | INTRAMUSCULAR | Status: DC | PRN
  Administered 2013-06-02 – 2013-06-03 (×11): 1 mg via INTRAVENOUS
  Filled 2013-06-02 (×12): qty 1

## 2013-06-02 MED ORDER — NICOTINE 21 MG/24HR TD PT24
21.0000 mg | MEDICATED_PATCH | Freq: Every day | TRANSDERMAL | Status: DC
  Administered 2013-06-02 – 2013-06-04 (×3): 21 mg via TRANSDERMAL
  Filled 2013-06-02 (×3): qty 1

## 2013-06-02 MED ORDER — ADULT MULTIVITAMIN W/MINERALS CH
1.0000 | ORAL_TABLET | Freq: Every day | ORAL | Status: DC
  Administered 2013-06-03 – 2013-06-04 (×2): 1 via ORAL
  Filled 2013-06-02 (×3): qty 1

## 2013-06-02 MED ORDER — OXYCODONE HCL 5 MG PO TABS
5.0000 mg | ORAL_TABLET | ORAL | Status: DC | PRN
  Administered 2013-06-02 – 2013-06-03 (×7): 10 mg via ORAL
  Filled 2013-06-02 (×7): qty 2

## 2013-06-02 NOTE — Op Note (Signed)
NAME:  CORDERO, SURETTE NO.:  [id number]  MEDICAL RECORD NO.:  [id number]  LOCATION:  1308                         FACILITY:  [facility]  PHYSICIAN:  Ollen Gross, M.D.    DATE OF BIRTH:  19-Apr-1961  DATE OF PROCEDURE: DATE OF DISCHARGE:  06/01/2013                              OPERATIVE REPORT   PREOPERATIVE DIAGNOSIS:  Osteomyelitis, left foot.  POSTOPERATIVE DIAGNOSIS:  Osteomyelitis, left foot.  PROCEDURE:  Irrigation and debridement of left foot with partial resection of 5th metatarsal.  SURGEON:  Ollen Gross, M.D.  ASSISTANT:  Alexzandrew L. Perkins, PA-C.  ANESTHESIA:  General.  ESTIMATED BLOOD LOSS:  Minimal.  DRAINS:  None.  COMPLICATIONS:  None.  TOURNIQUET TIME:  12 minutes at 300 mmHg.  CONDITION:  Stable to recovery.  CLINICAL NOTE:  Scott Frye is a 52 year old male, who had a long history of issues related to his left foot.  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  He has gone on to have problems with left foot including clawed toes and has had a persistent nonhealing ulcer of the lateral aspect of his foot near the 5th metatarsal head.  He had this debrided 1 time and treated with a wound VAC and it never ended up closing.  He was treated down in [place] for this in the past.  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  He also had increasing drainage.  He had x- rays done yesterday at [facility] revealing osteomyelitis of the 5th metatarsal.  He presents now for surgical treatment.  PROCEDURE IN DETAIL:  After successful administration of general anesthetic, a tourniquet placed high on his left thigh.  The left lower extremity was prepped and draped usual sterile fashion.  Extremity was elevated and tourniquet inflated to 300 mmHg.  I made an incision over the lateral border of  the 5th metatarsal starting with the metatarsal head area and coursing back about 3-4 cm.  He had a large callus present on the lateral border of the metatarsal head with an open area that had been draining.  We made the incision and he ellipsed out the calluses distally.  We cut down to the level of the MTP joint.  There was a small amount of purulence there which was sent for Gram stain, C and S.  He also had a erosion of the 5th metatarsal head.  I resected about the distal 1.5 cm of the metatarsal.  This was down to healthy-appearing bone.  We sent the resected bone for culture.  The bone had a mottled appearance consistent with having had osteomyelitis as there was some destruction of the metatarsal head.  Wound was then copiously irrigated with about a 6 L saline solution.  The deep capsule overlying the bone was then closed with interrupted 2-0 Vicryl.  Tourniquet had been released already for total time of 12 minutes.  Subcu was then closed with interrupted 2-0 Vicryl and skin closed in interrupted 4-0 nylon. This area closed easily even after  I had ellipsed out the callus.  The incision was then cleaned and dried and a bulky sterile dressing applied.  He was then awakened and transported to recovery in stable condition.     Ollen Gross, M.D.     FA/MEDQ  D:  06/01/2013  T:  06/02/2013  Job:  [id number]

## 2013-06-02 NOTE — Progress Notes (Signed)
[facility] PROGRESS NOTE  Assessment/Plan: Acute osteomyelitis of left foot - Vanc 7.28.2014, s/p amputation 7.28.2014, change to doxy in am, monitor for 24hrs after that.Pt is not a diabetic Physical Therapy consult pending. - x-ray: soft tissue gas within the plantar aspect of the foot beneath the fifth MTP joint. - afebrile, persistent leukocytosis. Cbc in am   Code Status: Full Code  Family Communication: Spoke with wife at bedside  Disposition Plan: Admit to inpatient   Consultants:  none  Procedures:  none  Antibiotics:  Vanc 7.27.2014  Doxy 7.30.2014  HPI/Subjective: No complains  Objective: Filed Vitals:   06/01/13 1938 06/01/13 2136 06/02/13 0144 06/02/13 0624  BP:  138/83 118/62 119/64  Pulse:  93 101 97  Temp: 97.7 F (36.5 C) 98.2 F (36.8 C) 98.7 F (37.1 C) 98.6 F (37 C)  TempSrc: Oral Oral Oral Oral  Resp:  16 18 18   Height:      Weight:      SpO2:  100% 100% 98%    Intake/Output Summary (Last 24 hours) at 06/02/13 0846 Last data filed at 06/02/13 0757  Gross per 24 hour  Intake   5152 ml  Output   2010 ml  Net   3142 ml   Filed Weights   05/31/13 1950  Weight: 90 kg (198 lb 6.6 oz)    Exam:  General: Alert, awake, oriented x3, in no acute distress.  HEENT: No bruits, no goiter.  Heart: Regular rate and rhythm, without murmurs, rubs, gallops.  Lungs: Good air movement, clear to auscultation Abdomen: Soft, nontender, nondistended, positive bowel sounds.  Neuro: Grossly intact, nonfocal.   Data Reviewed: Basic Metabolic Panel:  Recent Labs Lab 05/31/13 1500 06/01/13 0356  NA 137 133*  K 4.6 4.0  CL 101 102  CO2 25 24  GLUCOSE 88 96  BUN 18 16  CREATININE 1.00 0.91  CALCIUM 9.1 8.3*   Liver Function Tests: No results found for this basename: AST, ALT, ALKPHOS, BILITOT, PROT, ALBUMIN,  in the last 168 hours No results found for this basename: LIPASE, AMYLASE,  in the last 168 hours No results found for this  basename: AMMONIA,  in the last 168 hours CBC:  Recent Labs Lab 05/31/13 1500 06/01/13 0356  WBC 15.9* 14.2*  NEUTROABS 10.8*  --   HGB 13.2 13.0  HCT 39.6 39.8  MCV 85.5 85.6  PLT 455* 426*   Cardiac Enzymes: No results found for this basename: CKTOTAL, CKMB, CKMBINDEX, TROPONINI,  in the last 168 hours BNP (last 3 results) No results found for this basename: PROBNP,  in the last 8760 hours CBG: No results found for this basename: GLUCAP,  in the last 168 hours  Recent Results (from the past 240 hour(s))  SURGICAL PCR SCREEN     Status: None   Collection Time    06/01/13  5:58 AM      Result Value Range Status   MRSA, PCR NEGATIVE  NEGATIVE Final   Staphylococcus aureus NEGATIVE  NEGATIVE Final   Comment:            The Xpert SA Assay (FDA     approved for NASAL specimens     in patients over 11 years of age),     is one component of     a comprehensive surveillance     program.  Test performance has     been validated by [employer] for patients greater  than or equal to 36 year old.     It is not intended     to diagnose infection nor to     guide or monitor treatment.  TISSUE CULTURE     Status: None   Collection Time    06/01/13  3:10 PM      Result Value Range Status   Specimen Description TOE LEFT FIFTH METATARSAL   Final   Special Requests NONE   Final   Gram Stain     Final   Value: RARE WBC PRESENT,BOTH PMN AND MONONUCLEAR     RARE GRAM NEGATIVE RODS   Culture Culture reincubated for better growth   Final   Report Status PENDING   Incomplete  ANAEROBIC CULTURE     Status: None   Collection Time    06/01/13  3:10 PM      Result Value Range Status   Specimen Description TOE LEFT FIFTH METATARSAL   Final   Special Requests NONE   Final   Gram Stain     Final   Value: RARE WBC PRESENT,BOTH PMN AND MONONUCLEAR     RARE GRAM NEGATIVE RODS   Culture PENDING   Incomplete   Report Status PENDING   Incomplete     Studies: Dg Foot Complete  Left  June 10, 2013   *RADIOLOGY REPORT*  Clinical Data: Nonhealing wound in the lateral aspect of the left foot and beneath the base of the fifth toe.  LEFT FOOT - COMPLETE 3+ VIEW  Comparison: No priors.  Findings: There is soft tissue gas within the plantar aspect of the foot beneath the fifth MTP joint.  There is osteolysis of the head of the fifth metatarsal, and there is dorsal dislocation of the digit beyond this level.  No acute displaced fracture is noted. Degenerative changes of osteoarthritis are noted, most severe at the first MTP joint.  IMPRESSION: 1.  The appearance of the foot is compatible with osteomyelitis of the head of the fifth metatarsal with overlying soft tissue ulcer and dislocation of the fifth MTP joint, as above.   Original Report Authenticated By: Trudie Reed, M.D.    Scheduled Meds: . enoxaparin (LOVENOX) injection  40 mg Subcutaneous Q24H  . multivitamin with minerals  1 tablet Oral q morning - 10a  . vancomycin  1,000 mg Intravenous Q8H   Continuous Infusions: . sodium chloride 75 mL/hr at 06/02/13 0757     Marinda Elk  [facility] Pager [phone]. If 8PM-8AM, please contact night-coverage at [email or web address], password [facility] 06/02/2013, 8:46 AM  LOS: 2 days

## 2013-06-02 NOTE — Progress Notes (Signed)
Patient is requesting me to call MD on call for iv pain medicine. He is very upset that only po is ordered, and has threatened to leave AMA. Hospitalist on call made aware and a one time order for iv pain med was ordered. Patient made aware of new order, and states he will speak to physician is the am about his pain medicine.

## 2013-06-02 NOTE — Progress Notes (Signed)
Subjective: 1 Day Post-Op Procedure(s) (LRB): Irrigation and debridiment left foot and partial 5th amputation (Left) Patient reports pain as severe.  He had a very tough night. Patient seen in rounds with Dr. Lequita Halt. Family in room at bedside. Patient is having problems with pain in the foot, requiring pain medications.  Will change meds around today. We will start therapy today.   Objective: Vital signs in last 24 hours: Temp:  [97.7 F (36.5 C)-99.4 F (37.4 C)] 98.6 F (37 C) (07/29 0624) Pulse Rate:  [83-107] 97 (07/29 0624) Resp:  [11-24] 18 (07/29 0624) BP: (95-138)/(50-83) 119/64 mmHg (07/29 0624) SpO2:  [95 %-100 %] 98 % (07/29 0624)  Intake/Output from previous day: 07/28 0701 - 07/29 0700 In: 4480.8 [P.O.:477; I.V.:3148.8; IV Piggyback:855] Out: 2010 [Urine:2000; Blood:10]     Recent Labs  05/31/13 1500 06/01/13 0356  HGB 13.2 13.0    Recent Labs  05/31/13 1500 06/01/13 0356  WBC 15.9* 14.2*  RBC 4.63 4.65  HCT 39.6 39.8  PLT 455* 426*    Recent Labs  05/31/13 1500 06/01/13 0356  NA 137 133*  K 4.6 4.0  CL 101 102  CO2 25 24  BUN 18 16  CREATININE 1.00 0.91  GLUCOSE 88 96  CALCIUM 9.1 8.3*   No results found for this basename: LABPT, INR,  in the last 72 hours  EXAM General - Patient is Alert and Appropriate Extremity - Sensation intact distally dressingn intact Dressing - dressing C/D/I Motor Function - intact, toes well on exam.   Past Medical History  Diagnosis Date  . Phlegmasia cerulea dolens   . Compartment syndrome of left lower extremity     Assessment/Plan: 1 Day Post-Op Procedure(s) (LRB): Irrigation and debridiment left foot and partial 5th amputation (Left) Principal Problem:   Acute osteomyelitis of left foot  Estimated body mass index is 25.46 kg/(m^2) as calculated from the following:   Height as of this encounter: 6\' 2"  (1.88 m).   Weight as of this encounter: 90 kg (198 lb 6.6 oz). Up with therapy  DVT  Prophylaxis - Lovenox Weight-Bearing as tolerated to left foot, weight bearing on the heel only. Do not rock forward onto toes. Change meds around for better pain control.  Scott Frye 06/02/2013, 8:31 AM

## 2013-06-03 DIAGNOSIS — D72819 Decreased white blood cell count, unspecified: Secondary | ICD-10-CM

## 2013-06-03 DIAGNOSIS — D75839 Thrombocytosis, unspecified: Secondary | ICD-10-CM

## 2013-06-03 DIAGNOSIS — D473 Essential (hemorrhagic) thrombocythemia: Secondary | ICD-10-CM

## 2013-06-03 LAB — CBC
Hemoglobin: 13.4 g/dL (ref 13.0–17.0)
MCH: 27.7 pg (ref 26.0–34.0)
MCHC: 32.2 g/dL (ref 30.0–36.0)

## 2013-06-03 MED ORDER — OXYCODONE HCL ER 15 MG PO T12A
15.0000 mg | EXTENDED_RELEASE_TABLET | Freq: Two times a day (BID) | ORAL | Status: DC
  Administered 2013-06-03 – 2013-06-04 (×3): 15 mg via ORAL
  Filled 2013-06-03 (×3): qty 1

## 2013-06-03 MED ORDER — HYDROMORPHONE HCL PF 1 MG/ML IJ SOLN
0.5000 mg | INTRAMUSCULAR | Status: DC | PRN
  Administered 2013-06-03 (×3): 1 mg via INTRAVENOUS
  Administered 2013-06-04: 0.5 mg via INTRAVENOUS
  Administered 2013-06-04: 1 mg via INTRAVENOUS
  Filled 2013-06-03 (×5): qty 1

## 2013-06-03 MED ORDER — OXYCODONE HCL 5 MG PO TABS
15.0000 mg | ORAL_TABLET | ORAL | Status: DC | PRN
  Administered 2013-06-03 – 2013-06-04 (×4): 15 mg via ORAL
  Filled 2013-06-03 (×4): qty 3

## 2013-06-03 MED ORDER — ACETAMINOPHEN 325 MG PO TABS: 650.0000 mg | ORAL_TABLET | Freq: Four times a day (QID) | ORAL | Status: DC | PRN

## 2013-06-03 NOTE — Progress Notes (Signed)
Subjective: 2 Days Post-Op Procedure(s) (LRB): Irrigation and debridiment left foot and partial 5th amputation (Left) Patient reports pain as mild.   Patient seen in rounds with Dr. Lequita Halt. Patient is well, but has had some minor complaints of pain in the foot, requiring pain medications Up with therapy today.  WBC is improving.  Objective: Vital signs in last 24 hours: Temp:  [98.9 F (37.2 C)-99.8 F (37.7 C)] 98.9 F (37.2 C) (07/30 0514) Pulse Rate:  [89-92] 89 (07/30 0514) Resp:  [18] 18 (07/30 0514) BP: (115-134)/(61-64) 115/61 mmHg (07/30 0514) SpO2:  [95 %-100 %] 95 % (07/30 0514)  Intake/Output from previous day: 07/29 0701 - 07/30 0700 In: 1011.3 [P.O.:340; I.V.:671.3] Out: 4075 [Urine:4075] Intake/Output this shift: Total I/O In: 890 [P.O.:240; I.V.:650] Out: 1200 [Urine:1200]   Recent Labs  05/31/13 1500 06/01/13 0356 06/03/13 0422  HGB 13.2 13.0 13.4    Recent Labs  06/01/13 0356 06/03/13 0422  WBC 14.2* 13.5*  RBC 4.65 4.84  HCT 39.8 41.6  PLT 426* 503*    Recent Labs  05/31/13 1500 06/01/13 0356  NA 137 133*  K 4.6 4.0  CL 101 102  CO2 25 24  BUN 18 16  CREATININE 1.00 0.91  GLUCOSE 88 96  CALCIUM 9.1 8.3*   No results found for this basename: LABPT, INR,  in the last 72 hours  EXAM General - Patient is Alert, Appropriate and Oriented Extremity - Neurovascular intact Sensation intact distally Dressing - dressing C/D/I, Dressing changed and incision looks good. Motor Function - intact, moving toes well on exam.   Past Medical History  Diagnosis Date  . Phlegmasia cerulea dolens   . Compartment syndrome of left lower extremity     Assessment/Plan: 2 Days Post-Op Procedure(s) (LRB): Irrigation and debridiment left foot and partial 5th amputation (Left) Principal Problem:   Acute osteomyelitis of left foot Active Problems:   Leukocytopenia, unspecified   Thrombocytosis  Estimated body mass index is 25.46 kg/(m^2) as  calculated from the following:   Height as of this encounter: 6\' 2"  (1.88 m).   Weight as of this encounter: 90 kg (198 lb 6.6 oz). Up with therapy  DVT Prophylaxis - Lovenox Weight-Bearing as tolerated to left foot, weight bearing on the heel only. Do not rock forward onto toes.  PERKINS, ALEXZANDREW 06/03/2013, 2:02 PM

## 2013-06-03 NOTE — Progress Notes (Signed)
PT Cancellation Note  Patient Details Name: Scott Frye MRN: [id number] DOB: [birth year]   Cancelled Treatment:    Reason Eval/Treat Not Completed: PT screened, no needs identified, will sign off. Pt has DME, has dealt with L foot issues x 2 years per pt. Pt acknowledges  no pressure on forefoot. Able to ambulate with either RW or crutches.Rada Hay 06/03/2013, 2:11 PM Blanchard Kelch PT [phone]

## 2013-06-03 NOTE — Progress Notes (Addendum)
[facility] PROGRESS NOTE  Assessment/Plan:  Acute osteomyelitis of left foot, culture growing coag neg Staph - Vanc 7.28.2014, s/p amputation 7.28.2014 - Start doxycycline today - Physical Therapy consult pending - x-ray: soft tissue gas within the plantar aspect of the foot beneath the fifth MTP joint. - If doing well, plan for discharge to home tomorrow morning.   Leukocytosis and thrombocytosis likely related to osteomyelitis  Pain controlled, however, requiring high doses of narcotic pain medication IV and PO -  Start oxycontin 15mg  BID -  Increase to oxycodone 15mg  q4h prn  -  Decrease IV dilaudid  Code Status: Full Code  Family Communication: Spoke with wife at bedside  Disposition Plan: Admit to inpatient   Consultants:  none  Procedures:  none  Antibiotics:  Vanc 7.27.2014  Doxy 7.30.2014  HPI/Subjective: No complains, has not been ambulating yet.  Eating well.  Pain improved.  Objective: Filed Vitals:   06/02/13 0624 06/02/13 1400 06/02/13 2100 06/03/13 0514  BP: 119/64 129/62 134/64 115/61  Pulse: 97 95 92 89  Temp: 98.6 F (37 C) 99 F (37.2 C) 99.8 F (37.7 C) 98.9 F (37.2 C)  TempSrc: Oral Oral Oral Oral  Resp: 18 16 18 18   Height:      Weight:      SpO2: 98% 97% 100% 95%    Intake/Output Summary (Last 24 hours) at 06/03/13 1237 Last data filed at 06/03/13 1000  Gross per 24 hour  Intake    340 ml  Output   4575 ml  Net  -4235 ml   Filed Weights   05/31/13 1950  Weight: 90 kg (198 lb 6.6 oz)    Exam:  General: Alert, awake, oriented x3, in no acute distress.  HEENT: No bruits, no goiter.  Heart: Regular rate and rhythm, without murmurs, rubs, gallops.  Lungs: Good air movement, clear to auscultation Abdomen: Soft, nontender, nondistended, positive bowel sounds.  Neuro: Grossly intact, nonfocal. MSK:  LLE with bandage c/d/i.  Warm toes, < 2sec CR.  Wiggles toes without difficulty.  Data Reviewed: Basic Metabolic  Panel:  Recent Labs Lab 05/31/13 1500 06/01/13 0356  NA 137 133*  K 4.6 4.0  CL 101 102  CO2 25 24  GLUCOSE 88 96  BUN 18 16  CREATININE 1.00 0.91  CALCIUM 9.1 8.3*   Liver Function Tests: No results found for this basename: AST, ALT, ALKPHOS, BILITOT, PROT, ALBUMIN,  in the last 168 hours No results found for this basename: LIPASE, AMYLASE,  in the last 168 hours No results found for this basename: AMMONIA,  in the last 168 hours CBC:  Recent Labs Lab 05/31/13 1500 06/01/13 0356 06/03/13 0422  WBC 15.9* 14.2* 13.5*  NEUTROABS 10.8*  --   --   HGB 13.2 13.0 13.4  HCT 39.6 39.8 41.6  MCV 85.5 85.6 86.0  PLT 455* 426* 503*   Cardiac Enzymes: No results found for this basename: CKTOTAL, CKMB, CKMBINDEX, TROPONINI,  in the last 168 hours BNP (last 3 results) No results found for this basename: PROBNP,  in the last 8760 hours CBG: No results found for this basename: GLUCAP,  in the last 168 hours  Recent Results (from the past 240 hour(s))  SURGICAL PCR SCREEN     Status: None   Collection Time    06/01/13  5:58 AM      Result Value Range Status   MRSA, PCR NEGATIVE  NEGATIVE Final   Staphylococcus aureus NEGATIVE  NEGATIVE Final  Comment:            The Xpert SA Assay (FDA     approved for NASAL specimens     in patients over 22 years of age),     is one component of     a comprehensive surveillance     program.  Test performance has     been validated by [employer] for patients greater     than or equal to 57 year old.     It is not intended     to diagnose infection nor to     guide or monitor treatment.  TISSUE CULTURE     Status: None   Collection Time    06/01/13  3:10 PM      Result Value Range Status   Specimen Description TOE LEFT FIFTH METATARSAL   Final   Special Requests NONE   Final   Gram Stain     Final   Value: RARE WBC PRESENT,BOTH PMN AND MONONUCLEAR     RARE GRAM NEGATIVE RODS   Culture     Final   Value: FEW STAPHYLOCOCCUS  SPECIES (COAGULASE NEGATIVE)     Note: RIFAMPIN AND GENTAMICIN SHOULD NOT BE USED AS SINGLE DRUGS FOR TREATMENT OF STAPH INFECTIONS.   Report Status PENDING   Incomplete  ANAEROBIC CULTURE     Status: None   Collection Time    06/01/13  3:10 PM      Result Value Range Status   Specimen Description TOE LEFT FIFTH METATARSAL   Final   Special Requests NONE   Final   Gram Stain     Final   Value: RARE WBC PRESENT,BOTH PMN AND MONONUCLEAR     RARE GRAM NEGATIVE RODS   Culture     Final   Value: NO ANAEROBES ISOLATED; CULTURE IN PROGRESS FOR 5 DAYS   Report Status PENDING   Incomplete     Studies: No results found.  Scheduled Meds: . doxycycline  100 mg Oral Q12H  . enoxaparin (LOVENOX) injection  40 mg Subcutaneous Q24H  . multivitamin with minerals  1 tablet Oral Q breakfast  . nicotine  21 mg Transdermal Daily   Continuous Infusions: . sodium chloride 1,000 mL (06/03/13 0925)     Renae Fickle  [facility] Pager [phone]. If 8PM-8AM, please contact night-coverage at [email or web address], password [facility] 06/03/2013, 12:37 PM  LOS: 3 days

## 2013-06-04 LAB — TISSUE CULTURE

## 2013-06-04 MED ORDER — OXYCODONE HCL 15 MG PO TABS: 15.0000 mg | ORAL_TABLET | ORAL | Status: AC | PRN

## 2013-06-04 MED ORDER — DOXYCYCLINE HYCLATE 100 MG PO TABS: 100.0000 mg | ORAL_TABLET | Freq: Two times a day (BID) | ORAL | Status: AC

## 2013-06-04 NOTE — Discharge Summary (Signed)
Physician Discharge Summary  GREYCEN FELTER AVW:[id number] DOB: 04/03/1961 DOA: 05/31/2013  PCP: No primary provider on file.  Admit date: 05/31/2013 Discharge date: 06/04/2013  Recommendations for Outpatient Follow-up:  1. Patient to call to schedule a followup appointment with orthopedic surgery within one to one and a half weeks.    Discharge Diagnoses:  Principal Problem:   Acute osteomyelitis of left foot Active Problems:   Leukocytopenia, unspecified   Thrombocytosis   Discharge Condition: Stable, improved  Diet recommendation: Regular  Wt Readings from Last 3 Encounters:  05/31/13 90 kg (198 lb 6.6 oz)  05/31/13 90 kg (198 lb 6.6 oz)    History of present illness:  Scott Frye is a 52 y.o. male who presents to to the ED at [facility] with c/o L foot chronic ulcer that has become significantly more painful recently. 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 He developed clawwing in all of his lesser toes. He states that the wound did not heal on his foot and he had 6 weeks of ABx as well as a wound VAC at that time. He has had a persistent non-healing ulcer on the plantar surface of his foot since that time with increasing drainage and pain recently.  In the ED at [facility] he was running a WBC of 15k, X ray of his foot was worrisome for osteomyelitis. He was transferred to [facility] for admission and treatment.  Hospital Course:   Acute osteomyelitis of the left foot with associated cellulitis. He was started on broad-spectrum antibiotics with vancomycin and was seen by orthopedic surgery.  He underwent irrigation and debridement of the left foot with partial resection of the fifth metatarsal on July 28 by Doctor Aluisio.  He may bear weight as tolerated on the left foot on the heel only. He was instructed to not rock forward onto his toes. He was  given a postop shoe for ambulation and was seen by physical therapy who recommended no further followup.  For his cellulitis, he should continue antibiotics until they are completely gone. His erythema has been gradually resolving. He should have repeat examination by orthopedic surgery in approximately one to one and half weeks.    Leukocytosis and thrombocytosis were likely related to osteomyelitis and trended down.  His pain was controlled however he required high doses of narcotic pain medication both IV and oral.  Anticipate that he should have decreasing amount of pain over the next couple of days as he heals from surgery. I will give him a prescription for OxyContin 15 mg tabs to take every 6 hours as needed for pain, dispense #30, at 0 refills.  Consultants:  none Procedures:  none Antibiotics:  Vanc 7.27.2014  Doxy 7.30.2014   Discharge Exam: Filed Vitals:   06/04/13 0428  BP: 131/67  Pulse: 87  Temp: 98.1 F (36.7 C)  Resp: 16   Filed Vitals:   06/03/13 0514 06/03/13 1400 06/03/13 2111 06/04/13 0428  BP: 115/61 127/58 128/80 131/67  Pulse: 89 92 97 87  Temp: 98.9 F (37.2 C) 99 F (37.2 C) 98.1 F (36.7 C) 98.1 F (36.7 C)  TempSrc: Oral Oral Oral Oral  Resp: 18 18 18 16   Height:      Weight:      SpO2: 95% 100% 99% 96%   Patient states that the pain and redness have been improving  in his left foot. He feels well.  General: Alert, awake, oriented x3, in no acute distress.  HEENT: No bruits, no goiter.  Heart: Regular rate and rhythm, without murmurs, rubs, gallops.  Lungs: Good air movement, clear to auscultation  Abdomen: Soft, nontender, nondistended, positive bowel sounds.  Neuro: Grossly intact, nonfocal.  MSK: LLE with dry bandage in place, c/d/i. Warm toes, < 2sec CR. Wiggles toes without difficulty.  Discharge Instructions      Discharge Orders   Future Orders Complete By Expires     Call MD for:  difficulty breathing, headache or visual  disturbances  As directed     Call MD for:  extreme fatigue  As directed     Call MD for:  hives  As directed     Call MD for:  persistant dizziness or light-headedness  As directed     Call MD for:  persistant nausea and vomiting  As directed     Call MD for:  redness, tenderness, or signs of infection (pain, swelling, redness, odor or green/yellow discharge around incision site)  As directed     Call MD for:  severe uncontrolled pain  As directed     Call MD for:  temperature >100.4  As directed     Diet general  As directed     Discharge instructions  As directed     Comments:      You were hospitalized with cellulitis and osteomyelitis, otherwise known as bone infection.  You were started on antibiotics and were seen by the orthopedic surgeons who removed the infected area of bone.  Please continue antibiotics until they are gone. Please call to schedule a followup appointment with the orthopedic surgeons within one to one and a half weeks. The information is on your discharge paperwork. Please keep your incision clean, dry, and intact. Your sutures will be removed at her followup appointment. If you notice any increased redness, swelling, discharge, increased pain of her left foot, please seek immediate medical attention.    Increase activity slowly  As directed     Weight bearing as tolerated  As directed     Scheduling Instructions:      May be WBAT to the heel only. Do not rock forward on the toes. Postop wooden shoe for walking.        Medication List         acetaminophen 500 MG tablet  Commonly known as:  TYLENOL  Take 1,000 mg by mouth every 6 (six) hours as needed for pain.     doxycycline 100 MG tablet  Commonly known as:  VIBRA-TABS  Take 1 tablet (100 mg total) by mouth every 12 (twelve) hours.     gabapentin 600 MG tablet  Commonly known as:  NEURONTIN  Take 1,800 mg by mouth 3 (three) times daily as needed (pain).     multivitamin with minerals Tabs  Take 1  tablet by mouth every morning.     oxyCODONE 15 MG immediate release tablet  Commonly known as:  ROXICODONE  Take 1 tablet (15 mg total) by mouth every 4 (four) hours as needed.       Follow-up Information   Follow up with Loanne Drilling, MD. Schedule an appointment as soon as possible for a visit in 1 week. (Follow up at the end of next week or the first of the following week.)    Contact information:   [address] [place] [address] [phone]  The results of significant diagnostics from this hospitalization (including imaging, microbiology, ancillary and laboratory) are listed below for reference.    Significant Diagnostic Studies: Dg Foot Complete Left  05/31/2013   *RADIOLOGY REPORT*  Clinical Data: Nonhealing wound in the lateral aspect of the left foot and beneath the base of the fifth toe.  LEFT FOOT - COMPLETE 3+ VIEW  Comparison: No priors.  Findings: There is soft tissue gas within the plantar aspect of the foot beneath the fifth MTP joint.  There is osteolysis of the head of the fifth metatarsal, and there is dorsal dislocation of the digit beyond this level.  No acute displaced fracture is noted. Degenerative changes of osteoarthritis are noted, most severe at the first MTP joint.  IMPRESSION: 1.  The appearance of the foot is compatible with osteomyelitis of the head of the fifth metatarsal with overlying soft tissue ulcer and dislocation of the fifth MTP joint, as above.   Original Report Authenticated By: Trudie Reed, M.D.    Microbiology: Recent Results (from the past 240 hour(s))  SURGICAL PCR SCREEN     Status: None   Collection Time    06/01/13  5:58 AM      Result Value Range Status   MRSA, PCR NEGATIVE  NEGATIVE Final   Staphylococcus aureus NEGATIVE  NEGATIVE Final   Comment:            The Xpert SA Assay (FDA     approved for NASAL specimens     in patients over 80 years of age),     is one component of     a  comprehensive surveillance     program.  Test performance has     been validated by [employer] for patients greater     than or equal to 53 year old.     It is not intended     to diagnose infection nor to     guide or monitor treatment.  TISSUE CULTURE     Status: None   Collection Time    06/01/13  3:10 PM      Result Value Range Status   Specimen Description TOE LEFT FIFTH METATARSAL   Final   Special Requests NONE   Final   Gram Stain     Final   Value: RARE WBC PRESENT,BOTH PMN AND MONONUCLEAR     RARE GRAM NEGATIVE RODS   Culture     Final   Value: FEW STAPHYLOCOCCUS SPECIES (COAGULASE NEGATIVE)     Note: RIFAMPIN AND GENTAMICIN SHOULD NOT BE USED AS SINGLE DRUGS FOR TREATMENT OF STAPH INFECTIONS.   Report Status PENDING   Incomplete  ANAEROBIC CULTURE     Status: None   Collection Time    06/01/13  3:10 PM      Result Value Range Status   Specimen Description TOE LEFT FIFTH METATARSAL   Final   Special Requests NONE   Final   Gram Stain     Final   Value: RARE WBC PRESENT,BOTH PMN AND MONONUCLEAR     RARE GRAM NEGATIVE RODS   Culture     Final   Value: NO ANAEROBES ISOLATED; CULTURE IN PROGRESS FOR 5 DAYS   Report Status PENDING   Incomplete     Labs: Basic Metabolic Panel:  Recent Labs Lab 05/31/13 1500 06/01/13 0356  NA 137 133*  K 4.6 4.0  CL 101 102  CO2 25 24  GLUCOSE 88 96  BUN 18 16  CREATININE 1.00 0.91  CALCIUM 9.1 8.3*   Liver Function Tests: No results found for this basename: AST, ALT, ALKPHOS, BILITOT, PROT, ALBUMIN,  in the last 168 hours No results found for this basename: LIPASE, AMYLASE,  in the last 168 hours No results found for this basename: AMMONIA,  in the last 168 hours CBC:  Recent Labs Lab 05/31/13 1500 06/01/13 0356 06/03/13 0422  WBC 15.9* 14.2* 13.5*  NEUTROABS 10.8*  --   --   HGB 13.2 13.0 13.4  HCT 39.6 39.8 41.6  MCV 85.5 85.6 86.0  PLT 455* 426* 503*   Cardiac Enzymes: No results found for this  basename: CKTOTAL, CKMB, CKMBINDEX, TROPONINI,  in the last 168 hours BNP: BNP (last 3 results) No results found for this basename: PROBNP,  in the last 8760 hours CBG: No results found for this basename: GLUCAP,  in the last 168 hours  Time coordinating discharge: 45 minutes  Signed:  Tanush Drees  [facility] 06/04/2013, 10:37 AM

## 2013-06-04 NOTE — Progress Notes (Signed)
Subjective: 3 Days Post-Op Procedure(s) (LRB): Irrigation and debridiment left foot and partial 5th amputation (Left) Patient reports pain as mild.   Patient seen in rounds with Dr. Lequita Halt. Patient is well, and has had no acute complaints or problems Continue therapy today.  Plan is to go Home after hospital stay.  Objective: Vital signs in last 24 hours: Temp:  [98.1 F (36.7 C)-99 F (37.2 C)] 98.1 F (36.7 C) (07/31 0428) Pulse Rate:  [87-97] 87 (07/31 0428) Resp:  [16-18] 16 (07/31 0428) BP: (127-131)/(58-80) 131/67 mmHg (07/31 0428) SpO2:  [96 %-100 %] 96 % (07/31 0428)  Intake/Output from previous day: 07/30 0701 - 07/31 0700 In: 1970 [P.O.:1320; I.V.:650] Out: 3925 [Urine:3925] Intake/Output this shift:     Recent Labs  06/03/13 0422  HGB 13.4    Recent Labs  06/03/13 0422  WBC 13.5*  RBC 4.84  HCT 41.6  PLT 503*   No results found for this basename: NA, K, CL, CO2, BUN, CREATININE, GLUCOSE, CALCIUM,  in the last 72 hours No results found for this basename: LABPT, INR,  in the last 72 hours  EXAM General - Patient is Alert, Appropriate and Oriented Extremity - Neurovascular intact Sensation intact distally Dressing - changed, incision looks good, no drainage, sututes intact Motor Function - intact, moving toes well on exam.   Past Medical History  Diagnosis Date  . Phlegmasia cerulea dolens   . Compartment syndrome of left lower extremity     Assessment/Plan: 3 Days Post-Op Procedure(s) (LRB): Irrigation and debridiment left foot and partial 5th amputation (Left) Principal Problem:   Acute osteomyelitis of left foot Active Problems:   Leukocytopenia, unspecified   Thrombocytosis  Estimated body mass index is 25.46 kg/(m^2) as calculated from the following:   Height as of this encounter: 6\' 2"  (1.88 m).   Weight as of this encounter: 90 kg (198 lb 6.6 oz). Up with therapy Discharge home with home health  DVT Prophylaxis -  Lovenox Weight-Bearing as tolerated to left foot, weight bearing on the heel only. Do not rock forward onto toes. Postop shoe for ambulation.  May go home from Ortho standpoint.  Discharge as per Medicine. Follow up with Dr. Lequita Halt end of next week or the first of the following week for suture removal.  Hatsuko Bizzarro 06/04/2013, 7:42 AM

## 2013-06-06 LAB — ANAEROBIC CULTURE

## 2013-06-23 ENCOUNTER — Encounter (HOSPITAL_BASED_OUTPATIENT_CLINIC_OR_DEPARTMENT_OTHER): Payer: Self-pay | Admitting: *Deleted

## 2013-06-23 ENCOUNTER — Emergency Department (HOSPITAL_BASED_OUTPATIENT_CLINIC_OR_DEPARTMENT_OTHER)
Admission: EM | Admit: 2013-06-23 | Discharge: 2013-06-23 | Disposition: A | Discharge status: Home / Self Care | Payer: Medicare Other | Attending: Emergency Medicine | Admitting: Emergency Medicine

## 2013-06-23 DIAGNOSIS — F17200 Nicotine dependence, unspecified, uncomplicated: Secondary | ICD-10-CM | POA: Insufficient documentation

## 2013-06-23 DIAGNOSIS — K1370 Unspecified lesions of oral mucosa: Secondary | ICD-10-CM | POA: Insufficient documentation

## 2013-06-23 DIAGNOSIS — G8929 Other chronic pain: Secondary | ICD-10-CM | POA: Insufficient documentation

## 2013-06-23 DIAGNOSIS — Z8679 Personal history of other diseases of the circulatory system: Secondary | ICD-10-CM | POA: Insufficient documentation

## 2013-06-23 DIAGNOSIS — R52 Pain, unspecified: Secondary | ICD-10-CM | POA: Insufficient documentation

## 2013-06-23 DIAGNOSIS — Z87828 Personal history of other (healed) physical injury and trauma: Secondary | ICD-10-CM | POA: Insufficient documentation

## 2013-06-23 DIAGNOSIS — K0889 Other specified disorders of teeth and supporting structures: Secondary | ICD-10-CM

## 2013-06-23 DIAGNOSIS — Z79899 Other long term (current) drug therapy: Secondary | ICD-10-CM | POA: Insufficient documentation

## 2013-06-23 DIAGNOSIS — K089 Disorder of teeth and supporting structures, unspecified: Secondary | ICD-10-CM | POA: Insufficient documentation

## 2013-06-23 MED ORDER — TRAMADOL HCL 50 MG PO TABS: 50.0000 mg | ORAL_TABLET | Freq: Four times a day (QID) | ORAL | Status: DC | PRN

## 2013-06-23 MED ORDER — HYDROCODONE-ACETAMINOPHEN 5-325 MG PO TABS
1.0000 | ORAL_TABLET | Freq: Once | ORAL | Status: AC
  Administered 2013-06-23: 1 via ORAL
  Filled 2013-06-23: qty 1

## 2013-06-23 MED ORDER — PENICILLIN V POTASSIUM 500 MG PO TABS: 500.0000 mg | ORAL_TABLET | Freq: Four times a day (QID) | ORAL | Status: DC

## 2013-06-23 NOTE — ED Provider Notes (Signed)
  CSN: [id number]     Arrival date & time 06/23/13  1111 History     First MD Initiated Contact with Patient 06/23/13 1119     Chief Complaint  Patient presents with  . Dental Pain    HPI Patient complains of dental pain. It is diffuse. He states the worse areas her upper right. His upper partial plate. He has multiple fractured and carious teeth. No fevers no chills no mouth or throat swelling Past Medical History  Diagnosis Date  . Phlegmasia cerulea dolens   . Compartment syndrome of left lower extremity    Past Surgical History  Procedure Laterality Date  . Foot surgery    . Fasciotomy      left leg secondary to compartment syndrome form DVT  . Tonsillectomy    . Amputation Left 06/01/2013    Procedure: Irrigation and debridiment left foot and partial 5th amputation;  Surgeon: Loanne Drilling, MD;  Location: [facility];  Service: Orthopedics;  Laterality: Left;   No family history on file. History  Substance Use Topics  . Smoking status: Current Every Day Smoker  . Smokeless tobacco: Not on file  . Alcohol Use: No    Review of Systems  HENT: Positive for mouth sores and dental problem. Negative for trouble swallowing.     Allergies  Review of patient's allergies indicates no known allergies.  Home Medications   Current Outpatient Rx  Name  Route  Sig  Dispense  Refill  . acetaminophen (TYLENOL) 500 MG tablet   Oral   Take 1,000 mg by mouth every 6 (six) hours as needed for pain.         Marland Kitchen doxycycline (VIBRA-TABS) 100 MG tablet   Oral   Take 1 tablet (100 mg total) by mouth every 12 (twelve) hours.   12 tablet   0   . gabapentin (NEURONTIN) 600 MG tablet   Oral   Take 1,800 mg by mouth 3 (three) times daily as needed (pain).         . Multiple Vitamin (MULTIVITAMIN WITH MINERALS) TABS   Oral   Take 1 tablet by mouth every morning.         Marland Kitchen oxyCODONE (ROXICODONE) 15 MG immediate release tablet   Oral   Take 1 tablet (15 mg total) by mouth every 4  (four) hours as needed.   30 tablet   0   . penicillin v potassium (VEETID) 500 MG tablet   Oral   Take 1 tablet (500 mg total) by mouth 4 (four) times daily.   40 tablet   0   . traMADol (ULTRAM) 50 MG tablet   Oral   Take 1 tablet (50 mg total) by mouth every 6 (six) hours as needed for pain.   15 tablet   0    BP 132/69  Pulse 101  Temp(Src) 98.7 F (37.1 C) (Oral)  Resp 18  SpO2 99% Physical Exam Dental exam shows multiple fractured and carious teeth. Some thickening of the gingiva diffusely no frank localized area of fluctuance or abscess formation no swelling in the floor the mouth soft floor of the mouth. No swelling or induration or brawniness to the neck. ED Course   Procedures (including critical care time)  Labs Reviewed - No data to display No results found. 1. Pain, dental     MDM  Appears to be acute exacerbation chronic dental pain he was given a dental referral  Claudean Kinds, MD 06/23/13 1148

## 2013-06-23 NOTE — ED Notes (Signed)
Toothache started two days ago, patient states that the pain is on both sides and hes not sure if its the top or bottom teeth.

## 2013-07-09 ENCOUNTER — Emergency Department (HOSPITAL_BASED_OUTPATIENT_CLINIC_OR_DEPARTMENT_OTHER)
Admission: EM | Admit: 2013-07-09 | Discharge: 2013-07-09 | Disposition: A | Discharge status: Home / Self Care | Payer: Medicare Other | Attending: Emergency Medicine | Admitting: Emergency Medicine

## 2013-07-09 ENCOUNTER — Encounter (HOSPITAL_BASED_OUTPATIENT_CLINIC_OR_DEPARTMENT_OTHER): Payer: Self-pay

## 2013-07-09 DIAGNOSIS — Z8679 Personal history of other diseases of the circulatory system: Secondary | ICD-10-CM | POA: Insufficient documentation

## 2013-07-09 DIAGNOSIS — K056 Periodontal disease, unspecified: Secondary | ICD-10-CM | POA: Insufficient documentation

## 2013-07-09 DIAGNOSIS — Z8739 Personal history of other diseases of the musculoskeletal system and connective tissue: Secondary | ICD-10-CM | POA: Insufficient documentation

## 2013-07-09 DIAGNOSIS — F17200 Nicotine dependence, unspecified, uncomplicated: Secondary | ICD-10-CM | POA: Insufficient documentation

## 2013-07-09 MED ORDER — PENICILLIN V POTASSIUM 500 MG PO TABS: 500.0000 mg | ORAL_TABLET | Freq: Four times a day (QID) | ORAL | Status: AC

## 2013-07-09 MED ORDER — HYDROCODONE-ACETAMINOPHEN 5-325 MG PO TABS: 1.0000 | ORAL_TABLET | Freq: Four times a day (QID) | ORAL | Status: DC | PRN

## 2013-07-09 MED ORDER — IBUPROFEN 600 MG PO TABS: 600.0000 mg | ORAL_TABLET | Freq: Four times a day (QID) | ORAL | Status: DC | PRN

## 2013-07-09 NOTE — ED Provider Notes (Signed)
CSN: [id number]     Arrival date & time 07/09/13  1234 History   First MD Initiated Contact with Patient 07/09/13 1251     Chief Complaint  Patient presents with  . Dental Pain   (Consider location/radiation/quality/duration/timing/severity/associated sxs/prior Treatment) HPI Comments: Pt comes in with toothache, sudden onset yday post prandial. Pain is throbbing, worse with air, hot and cold. Has known poor dentition, just not seeing Dentist as he doesnt have insurance. No n/v/f/c. No trismus.   Patient is a 52 y.o. male presenting with tooth pain. The history is provided by the patient.  Dental Pain Associated symptoms: no drooling and no fever     Past Medical History  Diagnosis Date  . Phlegmasia cerulea dolens   . Compartment syndrome of left lower extremity    Past Surgical History  Procedure Laterality Date  . Foot surgery    . Fasciotomy      left leg secondary to compartment syndrome form DVT  . Tonsillectomy    . Amputation Left 06/01/2013    Procedure: Irrigation and debridiment left foot and partial 5th amputation;  Surgeon: Loanne Drilling, MD;  Location: [facility];  Service: Orthopedics;  Laterality: Left;   No family history on file. History  Substance Use Topics  . Smoking status: Current Every Day Smoker  . Smokeless tobacco: Not on file  . Alcohol Use: No    Review of Systems  Constitutional: Negative for fever and chills.  HENT: Positive for dental problem. Negative for hearing loss and drooling.   Neurological: Negative for weakness.    Allergies  Review of patient's allergies indicates no known allergies.  Home Medications   Current Outpatient Rx  Name  Route  Sig  Dispense  Refill  . acetaminophen (TYLENOL) 500 MG tablet   Oral   Take 1,000 mg by mouth every 6 (six) hours as needed for pain.         Marland Kitchen doxycycline (VIBRA-TABS) 100 MG tablet   Oral   Take 1 tablet (100 mg total) by mouth every 12 (twelve) hours.   12 tablet   0   .  gabapentin (NEURONTIN) 600 MG tablet   Oral   Take 1,800 mg by mouth 3 (three) times daily as needed (pain).         Marland Kitchen HYDROcodone-acetaminophen (NORCO/VICODIN) 5-325 MG per tablet   Oral   Take 1 tablet by mouth every 6 (six) hours as needed for pain.   10 tablet   0   . ibuprofen (ADVIL,MOTRIN) 600 MG tablet   Oral   Take 1 tablet (600 mg total) by mouth every 6 (six) hours as needed for pain.   30 tablet   0   . Multiple Vitamin (MULTIVITAMIN WITH MINERALS) TABS   Oral   Take 1 tablet by mouth every morning.         Marland Kitchen oxyCODONE (ROXICODONE) 15 MG immediate release tablet   Oral   Take 1 tablet (15 mg total) by mouth every 4 (four) hours as needed.   30 tablet   0   . penicillin v potassium (VEETID) 500 MG tablet   Oral   Take 1 tablet (500 mg total) by mouth 4 (four) times daily.   40 tablet   0   . penicillin v potassium (VEETID) 500 MG tablet   Oral   Take 1 tablet (500 mg total) by mouth 4 (four) times daily.   40 tablet   0   . traMADol (  ULTRAM) 50 MG tablet   Oral   Take 1 tablet (50 mg total) by mouth every 6 (six) hours as needed for pain.   15 tablet   0    BP 142/79  Pulse 97  Temp(Src) 99.3 F (37.4 C) (Oral)  Resp 16  Ht 6\' 2"  (1.88 m)  Wt 200 lb (90.719 kg)  BMI 25.67 kg/m2  SpO2 98% Physical Exam  Nursing note and vitals reviewed. Constitutional: He appears well-developed and well-nourished.  Tooth #28-32 - all are decayed. No gingival fluctuance, tenderness. No trismus.   HENT:  Head: Normocephalic and atraumatic.    ED Course  Procedures (including critical care time) Labs Review Labs Reviewed - No data to display Imaging Review No results found.  MDM   1. Periodontal disease     DDx includes: - Periapical tooth infection - Dental abscess - Gingivitis - Dental trauma - Pulpitis - Nerve root compression  Pt comes in with cc of toothache. Exam is benign -no concerns for deep infection. Given the air  sensitivity, and temperature sensitivity - likely periodontal infection, or nerve irritation. Will give AB.  Strongly advised f/u wit Dentist.  Derwood Kaplan, MD 07/09/13 [phone]

## 2013-07-09 NOTE — ED Notes (Signed)
C/o right lower tooth ache x "months"-worse since last night

## 2016-01-09 DIAGNOSIS — S91302A Unspecified open wound, left foot, initial encounter: Secondary | ICD-10-CM | POA: Diagnosis not present

## 2016-01-09 DIAGNOSIS — G9050 Complex regional pain syndrome I, unspecified: Secondary | ICD-10-CM | POA: Diagnosis not present

## 2016-01-09 DIAGNOSIS — R69 Illness, unspecified: Secondary | ICD-10-CM | POA: Diagnosis not present

## 2016-01-09 DIAGNOSIS — M792 Neuralgia and neuritis, unspecified: Secondary | ICD-10-CM | POA: Diagnosis not present

## 2016-02-01 DIAGNOSIS — R61 Generalized hyperhidrosis: Secondary | ICD-10-CM | POA: Diagnosis not present

## 2016-02-01 DIAGNOSIS — R911 Solitary pulmonary nodule: Secondary | ICD-10-CM | POA: Diagnosis not present

## 2016-02-01 DIAGNOSIS — Z86718 Personal history of other venous thrombosis and embolism: Secondary | ICD-10-CM | POA: Diagnosis not present

## 2016-02-01 DIAGNOSIS — Z79891 Long term (current) use of opiate analgesic: Secondary | ICD-10-CM | POA: Diagnosis not present

## 2016-02-01 DIAGNOSIS — R69 Illness, unspecified: Secondary | ICD-10-CM | POA: Diagnosis not present

## 2016-02-01 DIAGNOSIS — R935 Abnormal findings on diagnostic imaging of other abdominal regions, including retroperitoneum: Secondary | ICD-10-CM | POA: Diagnosis not present

## 2016-02-01 DIAGNOSIS — R079 Chest pain, unspecified: Secondary | ICD-10-CM | POA: Diagnosis not present

## 2016-02-01 DIAGNOSIS — R072 Precordial pain: Secondary | ICD-10-CM | POA: Diagnosis not present

## 2016-02-01 DIAGNOSIS — Z8673 Personal history of transient ischemic attack (TIA), and cerebral infarction without residual deficits: Secondary | ICD-10-CM | POA: Diagnosis not present

## 2016-02-01 DIAGNOSIS — G90522 Complex regional pain syndrome I of left lower limb: Secondary | ICD-10-CM | POA: Diagnosis not present

## 2016-02-01 DIAGNOSIS — R0789 Other chest pain: Secondary | ICD-10-CM | POA: Diagnosis not present

## 2016-02-01 DIAGNOSIS — R202 Paresthesia of skin: Secondary | ICD-10-CM | POA: Diagnosis not present

## 2016-02-01 DIAGNOSIS — R200 Anesthesia of skin: Secondary | ICD-10-CM | POA: Diagnosis not present

## 2016-02-01 DIAGNOSIS — M47814 Spondylosis without myelopathy or radiculopathy, thoracic region: Secondary | ICD-10-CM | POA: Diagnosis not present

## 2016-02-02 DIAGNOSIS — G90522 Complex regional pain syndrome I of left lower limb: Secondary | ICD-10-CM | POA: Diagnosis not present

## 2016-02-02 DIAGNOSIS — Z86718 Personal history of other venous thrombosis and embolism: Secondary | ICD-10-CM | POA: Diagnosis not present

## 2016-02-02 DIAGNOSIS — R079 Chest pain, unspecified: Secondary | ICD-10-CM | POA: Diagnosis not present

## 2016-02-02 DIAGNOSIS — R911 Solitary pulmonary nodule: Secondary | ICD-10-CM | POA: Diagnosis not present

## 2016-02-02 DIAGNOSIS — R69 Illness, unspecified: Secondary | ICD-10-CM | POA: Diagnosis not present

## 2016-02-02 DIAGNOSIS — Z8673 Personal history of transient ischemic attack (TIA), and cerebral infarction without residual deficits: Secondary | ICD-10-CM | POA: Diagnosis not present

## 2016-03-23 DIAGNOSIS — I739 Peripheral vascular disease, unspecified: Secondary | ICD-10-CM | POA: Diagnosis not present

## 2016-04-18 ENCOUNTER — Encounter (HOSPITAL_BASED_OUTPATIENT_CLINIC_OR_DEPARTMENT_OTHER): Payer: Self-pay

## 2016-04-18 ENCOUNTER — Emergency Department (HOSPITAL_BASED_OUTPATIENT_CLINIC_OR_DEPARTMENT_OTHER)
Admission: EM | Admit: 2016-04-18 | Discharge: 2016-04-18 | Disposition: A | Discharge status: Home / Self Care | Payer: Medicare HMO | Attending: Emergency Medicine | Admitting: Emergency Medicine

## 2016-04-18 ENCOUNTER — Emergency Department (HOSPITAL_BASED_OUTPATIENT_CLINIC_OR_DEPARTMENT_OTHER): Payer: Medicare HMO

## 2016-04-18 DIAGNOSIS — L84 Corns and callosities: Secondary | ICD-10-CM | POA: Diagnosis not present

## 2016-04-18 DIAGNOSIS — M216X2 Other acquired deformities of left foot: Secondary | ICD-10-CM | POA: Diagnosis not present

## 2016-04-18 DIAGNOSIS — M7989 Other specified soft tissue disorders: Secondary | ICD-10-CM | POA: Diagnosis not present

## 2016-04-18 DIAGNOSIS — M21962 Unspecified acquired deformity of left lower leg: Secondary | ICD-10-CM

## 2016-04-18 DIAGNOSIS — F17200 Nicotine dependence, unspecified, uncomplicated: Secondary | ICD-10-CM | POA: Insufficient documentation

## 2016-04-18 DIAGNOSIS — M79672 Pain in left foot: Secondary | ICD-10-CM

## 2016-04-18 DIAGNOSIS — M205X1 Other deformities of toe(s) (acquired), right foot: Secondary | ICD-10-CM | POA: Diagnosis not present

## 2016-04-18 DIAGNOSIS — R69 Illness, unspecified: Secondary | ICD-10-CM | POA: Diagnosis not present

## 2016-04-18 MED ORDER — LIDOCAINE HCL (PF) 1 % IJ SOLN
5.0000 mL | Freq: Once | INTRAMUSCULAR | Status: DC
  Filled 2016-04-18: qty 5

## 2016-04-18 MED ORDER — DOXYCYCLINE HYCLATE 100 MG PO CAPS: 100.0000 mg | ORAL_CAPSULE | Freq: Two times a day (BID) | ORAL | Status: AC

## 2016-04-18 MED ORDER — HYDROCODONE-ACETAMINOPHEN 5-325 MG PO TABS: 1.0000 | ORAL_TABLET | ORAL | Status: AC | PRN

## 2016-04-18 NOTE — ED Notes (Signed)
C/o "sore" to bottom of left foot x "years"-states area is worse with foul smelling d/c with increase in pain x 3 weeks-NAD-steady gait

## 2016-04-18 NOTE — ED Notes (Signed)
MD at bedside. 

## 2016-04-18 NOTE — ED Provider Notes (Signed)
CSN: [id number]     Arrival date & time 04/18/16  1858 History   By signing my name below, I, Reola Mosher, attest that this documentation has been prepared under the direction and in the presence of Charlesetta Shanks, MD.  Electronically Signed: Reola Mosher, ED Scribe. 04/18/2016. 8:22 PM.   Chief Complaint  Patient presents with  . Foot Pain   The history is provided by the patient. No language interpreter was used.   HPI Comments: Scott Frye is a 55 y.o. male who presents to the Emergency Department complaining of grad onset, grad worsening, constant, acute on chronic, L foot pain onset 3 weeks ago. Pt reports that he also has a wound on the sole of his left foot that will not heal, and recently has produced a foul-smelling discharge. His pain is aggravated upon ambulation. He reports that he has been taking pain management medications that were prescribed by Dr. Jimmye Norman at [facility] with no relief. Pt also states that his PCP tried to have him f/u with a Vascular Surgeon for his sx, but he did not attend his appointment because it was too far for him to drive. 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 Gaynelle Arabian of [facility]. Pt is not complaining of any other symptoms at this time.   Past Medical History  Diagnosis Date  . 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 Compartment syndrome of left lower extremity [facility])    Past Surgical History  Procedure Laterality Date  . Foot surgery    . Fasciotomy      left leg secondary to compartment syndrome form DVT  . Tonsillectomy    . Amputation Left 06/01/2013    Procedure: Irrigation and debridiment left foot and partial 5th amputation;  Surgeon: Gearlean Alf, MD;  Location: [facility] ORS;  Service: Orthopedics;  Laterality: Left;   No family history on file. Social History  Substance Use Topics  . Smoking status: Current Every Day Smoker  . Smokeless tobacco:  None  . Alcohol Use: No    Review of Systems 10 Systems reviewed and all are negative for acute change except as noted in the HPI.  Allergies  Review of patient's allergies indicates no known allergies.  Home Medications   Prior to Admission medications   Medication Sig Start Date End Date Taking? Authorizing Provider  doxycycline (VIBRA-TABS) 100 MG tablet Take 1 tablet (100 mg total) by mouth every 12 (twelve) hours. 06/04/13   Janece Canterbury, MD  doxycycline (VIBRAMYCIN) 100 MG capsule Take 1 capsule (100 mg total) by mouth 2 (two) times daily. One po bid x 7 days 04/18/16   Charlesetta Shanks, MD  HYDROcodone-acetaminophen (NORCO/VICODIN) 5-325 MG tablet Take 1-2 tablets by mouth every 4 (four) hours as needed for moderate pain or severe pain. 04/18/16   Charlesetta Shanks, MD  oxyCODONE (ROXICODONE) 15 MG immediate release tablet Take 1 tablet (15 mg total) by mouth every 4 (four) hours as needed. 06/04/13   Janece Canterbury, MD   BP 114/59 mmHg  Pulse 72  Temp(Src) 98.7 F (37.1 C) (Oral)  Resp 18  Ht 6\' 1"  (1.854 m)  Wt 173 lb (78.472 kg)  BMI 22.83 kg/m2  SpO2 98%   Physical Exam  Constitutional: He appears well-developed and well-nourished.  HENT:  Head: Normocephalic.  Eyes: Conjunctivae are normal.  Cardiovascular: Normal rate.   Pulmonary/Chest: Effort normal. No respiratory distress.  Abdominal: He exhibits no distension.  Musculoskeletal: Normal range of motion.  Left foot has a pronounced protrusion to the lateral, sole of the foot somewhat proximal to the base of the fifth metatarsal. This has very chronic appearance. There is over lying callus. He endorses some tenderness to palpation. I cannot appreciate any fluctuance. There is marginal erythema present. No general edema of the foot or lower leg. No streaking or cellulitis associated with the foot or the lower leg.  Neurological: He is alert.  Skin: Skin is warm and dry.  Psychiatric: He has a normal mood and affect.  His behavior is normal.  Nursing note and vitals reviewed.   ED Course  Procedures (including critical care time)  DIAGNOSTIC STUDIES: Oxygen Saturation is 98% on RA, normal by my interpretation.   COORDINATION OF CARE: 8:20 PM-Discussed next steps with pt including DG L foot. Pt verbalized understanding and is agreeable with the plan.   Imaging Review Dg Foot Complete Left  04/18/2016  CLINICAL DATA:  Left foot pain.  History of osteomyelitis. EXAM: LEFT FOOT - COMPLETE 3+ VIEW COMPARISON:  07/13/2015 left foot radiographs FINDINGS: Stable partial distal left fifth metatarsal resection. Soft tissue swelling and ulcer lateral to the proximal left fifth metatarsal. Stable periosteal reaction at the lateral proximal left fifth metatarsal shaft. No new sites of periosteal reaction. No cortical erosions. No appreciable erosive arthropathy. No fracture or dislocation. No suspicious focal osseous lesion. Small Achilles and plantar left calcaneal spurs. IMPRESSION: Soft tissue swelling and ulcer lateral to the proximal left fifth metatarsal. Periosteal reaction at the lateral proximal left fifth metatarsal shaft, not appreciably changed since 07/13/2015, consistent with chronic osteomyelitis. No specific radiographic findings of acute osteomyelitis. Electronically Signed   By: Ilona Sorrel M.D.   On: 04/18/2016 21:15   I have personally reviewed and evaluated these images as part of my medical decision-making.   MDM   Final diagnoses:  Foot pain, left  Foot deformity, acquired, left  Callus of foot   Patient has had a chronic wound to the foot. There are old surgical changes present. He has an area of enlarged callus that he reports has been draining. I have prepped this areas Betadine and trimmed some callus away but find no evidence of an abscess pocket. There is no drainage or discharge and no fluctuance. No x-ray change in the bony structures as compared with old. At this time the patient  will be empirically placed on doxycycline based on old medical record and prior infection treatment. Patient is counseled he must follow-up with orthopedics or podiatry. He is otherwise well without any evidence of systemic infection.    Charlesetta Shanks, MD 04/25/16 [phone]

## 2016-04-18 NOTE — Discharge Instructions (Signed)
Corns and Calluses Due to the deformity of your foot you appear to have chronic problems with  calluses that develop infection periodically. At this time, since you have had increasing pain and drainage, take antibiotics as prescribed. Take pain medication as needed. Clean the wound twice daily and apply antibiotic ointment and a bulky dressing. Schedule appointment with [facility] orthopedics as soon as possible. Corns are small areas of thickened skin that occur on the top, sides, or tip of a toe. They contain a cone-shaped core with a point that can press on a nerve below. This causes pain. Calluses are areas of thickened skin that can occur anywhere on the body including hands, fingers, palms, soles of the feet, and heels.Calluses are usually larger than corns.  CAUSES  Corns and calluses are caused by rubbing (friction) or pressure, such as from shoes that are too tight or do not fit properly.  RISK FACTORS Corns are more likely to develop in people who have toe deformities, such as hammer toes. Since calluses can occur with friction to any area of the skin, calluses are more likely to develop in people who:   Work with their hands.  Wear shoes that fit poorly, shoes that are too tight, or shoes that are high-heeled.  Have toes deformities. SYMPTOMS Symptoms of a corn or callus include:  A hard growth on the skin.   Pain or tenderness under the skin.   Redness and swelling.   Increased discomfort while wearing tight-fitting shoes. DIAGNOSIS  Corns and calluses may be diagnosed with a medical history and physical exam.  TREATMENT  Corns and calluses may be treated with:  Removing the cause of the friction or pressure. This may include:  Changing your shoes.  Wearing shoe inserts (orthotics) or other protective layers in your shoes, such as a corn pad.  Wearing gloves.  Medicines to help soften skin in the hardened, thickened areas.  Reducing the size of the corn or  callus by removing the dead layers of skin.  Antibiotic medicines to treat infection.  Surgery, if a toe deformity is the cause. HOME CARE INSTRUCTIONS   Take medicines only as directed by your health care provider.  If you were prescribed an antibiotic, finish all of it even if you start to feel better.  Wear shoes that fit well. Avoid wearing high-heeled shoes and shoes that are too tight or too loose.  Wear any padding, protective layers, gloves, or orthotics as directed by your health care provider.  Soak your hands or feet and then use a file or pumice stone to soften your corn or callus. Do this as directed by your health care provider.  Check your corn or callus every day for signs of infection. Watch for:  Redness, swelling, or pain.  Fluid, blood, or pus. SEEK MEDICAL CARE IF:   Your symptoms do not improve with treatment.  You have increased redness, swelling, or pain at the site of your corn or callus.  You have fluid, blood, or pus coming from your corn or callus.  You have new symptoms.   This information is not intended to replace advice given to you by your health care provider. Make sure you discuss any questions you have with your health care provider.   Document Released: 07/28/2004 Document Revised: 03/08/2015 Document Reviewed: 10/18/2014 Elsevier Interactive Patient Education [employer].

## 2016-05-14 DIAGNOSIS — X58XXXA Exposure to other specified factors, initial encounter: Secondary | ICD-10-CM | POA: Diagnosis not present

## 2016-05-14 DIAGNOSIS — S91309A Unspecified open wound, unspecified foot, initial encounter: Secondary | ICD-10-CM | POA: Diagnosis not present

## 2016-06-06 DIAGNOSIS — L97521 Non-pressure chronic ulcer of other part of left foot limited to breakdown of skin: Secondary | ICD-10-CM | POA: Diagnosis not present

## 2016-06-06 DIAGNOSIS — M25672 Stiffness of left ankle, not elsewhere classified: Secondary | ICD-10-CM | POA: Diagnosis not present

## 2016-06-06 DIAGNOSIS — M86672 Other chronic osteomyelitis, left ankle and foot: Secondary | ICD-10-CM | POA: Diagnosis not present

## 2016-06-06 DIAGNOSIS — L02612 Cutaneous abscess of left foot: Secondary | ICD-10-CM | POA: Diagnosis not present

## 2016-06-14 ENCOUNTER — Other Ambulatory Visit: Payer: Self-pay | Admitting: Vascular Surgery

## 2016-06-14 DIAGNOSIS — M86679 Other chronic osteomyelitis, unspecified ankle and foot: Secondary | ICD-10-CM

## 2016-07-03 ENCOUNTER — Encounter: Payer: Self-pay | Admitting: Vascular Surgery

## 2016-07-05 ENCOUNTER — Encounter: Payer: Medicare HMO | Admitting: Vascular Surgery

## 2016-07-05 ENCOUNTER — Ambulatory Visit (HOSPITAL_COMMUNITY): Payer: Medicare HMO | Attending: Vascular Surgery

## 2016-08-12 DIAGNOSIS — I10 Essential (primary) hypertension: Secondary | ICD-10-CM | POA: Diagnosis not present

## 2016-08-12 DIAGNOSIS — S72301A Unspecified fracture of shaft of right femur, initial encounter for closed fracture: Secondary | ICD-10-CM | POA: Diagnosis not present

## 2016-08-14 ENCOUNTER — Encounter: Payer: Self-pay | Admitting: Vascular Surgery

## 2016-08-14 DIAGNOSIS — S52021A Displaced fracture of olecranon process without intraarticular extension of right ulna, initial encounter for closed fracture: Secondary | ICD-10-CM | POA: Diagnosis not present

## 2016-08-16 ENCOUNTER — Encounter (HOSPITAL_COMMUNITY): Payer: Medicare HMO

## 2016-08-16 ENCOUNTER — Encounter: Payer: Medicare HMO | Admitting: Vascular Surgery

## 2016-09-04 DIAGNOSIS — M79672 Pain in left foot: Secondary | ICD-10-CM | POA: Diagnosis not present

## 2016-09-09 ENCOUNTER — Emergency Department (HOSPITAL_BASED_OUTPATIENT_CLINIC_OR_DEPARTMENT_OTHER)
Admission: EM | Admit: 2016-09-09 | Discharge: 2016-09-09 | Disposition: A | Discharge status: Home / Self Care | Payer: Medicare HMO | Attending: Emergency Medicine | Admitting: Emergency Medicine

## 2016-09-09 ENCOUNTER — Encounter (HOSPITAL_BASED_OUTPATIENT_CLINIC_OR_DEPARTMENT_OTHER): Payer: Self-pay | Admitting: Emergency Medicine

## 2016-09-09 ENCOUNTER — Emergency Department (HOSPITAL_BASED_OUTPATIENT_CLINIC_OR_DEPARTMENT_OTHER): Payer: Medicare HMO

## 2016-09-09 DIAGNOSIS — F17200 Nicotine dependence, unspecified, uncomplicated: Secondary | ICD-10-CM | POA: Diagnosis not present

## 2016-09-09 DIAGNOSIS — R69 Illness, unspecified: Secondary | ICD-10-CM | POA: Diagnosis not present

## 2016-09-09 DIAGNOSIS — M79672 Pain in left foot: Secondary | ICD-10-CM | POA: Diagnosis present

## 2016-09-09 DIAGNOSIS — L03116 Cellulitis of left lower limb: Secondary | ICD-10-CM

## 2016-09-09 DIAGNOSIS — M7989 Other specified soft tissue disorders: Secondary | ICD-10-CM | POA: Diagnosis not present

## 2016-09-09 LAB — BASIC METABOLIC PANEL
ANION GAP: 7 (ref 5–15)
BUN: 17 mg/dL (ref 6–20)
CALCIUM: 8.7 mg/dL — AB (ref 8.9–10.3)
CHLORIDE: 98 mmol/L — AB (ref 101–111)
CO2: 30 mmol/L (ref 22–32)
CREATININE: 0.72 mg/dL (ref 0.61–1.24)
GFR calc non Af Amer: >60 mL/min (ref >60)
GLUCOSE: 97 mg/dL (ref 65–99)
Potassium: 3.6 mmol/L (ref 3.5–5.1)
Sodium: 135 mmol/L (ref 135–145)

## 2016-09-09 LAB — CBC WITH DIFFERENTIAL/PLATELET
Basophils Absolute: 0 10*3/uL (ref 0.0–0.1)
Basophils Relative: 0 %
Eosinophils Absolute: 0.2 10*3/uL (ref 0.0–0.7)
Eosinophils Relative: 3 %
HEMATOCRIT: 36.1 % — AB (ref 39.0–52.0)
HEMOGLOBIN: 11.7 g/dL — AB (ref 13.0–17.0)
LYMPHS PCT: 38 %
Lymphs Abs: 2.6 10*3/uL (ref 0.7–4.0)
MCH: 28.1 pg (ref 26.0–34.0)
MCHC: 32.4 g/dL (ref 30.0–36.0)
MCV: 86.6 fL (ref 78.0–100.0)
MONO ABS: 0.6 10*3/uL (ref 0.1–1.0)
MONOS PCT: 9 %
NEUTROS ABS: 3.5 10*3/uL (ref 1.7–7.7)
NEUTROS PCT: 50 %
Platelets: 342 10*3/uL (ref 150–400)
RBC: 4.17 MIL/uL — ABNORMAL LOW (ref 4.22–5.81)
RDW: 12.7 % (ref 11.5–15.5)
WBC: 6.9 10*3/uL (ref 4.0–10.5)

## 2016-09-09 LAB — SEDIMENTATION RATE: Sed Rate: 45 mm/hr — ABNORMAL HIGH (ref 0–16)

## 2016-09-09 LAB — C-REACTIVE PROTEIN: CRP: 8 mg/dL — AB (ref <1.0)

## 2016-09-09 MED ORDER — CLINDAMYCIN PHOSPHATE 600 MG/50ML IV SOLN: 600.0000 mg | Freq: Once | INTRAVENOUS | Status: DC

## 2016-09-09 MED ORDER — MORPHINE SULFATE (PF) 4 MG/ML IV SOLN
4.0000 mg | Freq: Once | INTRAVENOUS | Status: AC
  Administered 2016-09-09: 4 mg via INTRAVENOUS
  Filled 2016-09-09: qty 1

## 2016-09-09 MED ORDER — SODIUM CHLORIDE 0.9 % IV SOLN
INTRAVENOUS | Status: DC
  Administered 2016-09-09: 18:00:00 via INTRAVENOUS

## 2016-09-09 MED ORDER — CLINDAMYCIN HCL 150 MG PO CAPS: 450.0000 mg | ORAL_CAPSULE | Freq: Three times a day (TID) | ORAL | 0 refills | Status: AC

## 2016-09-09 MED ORDER — SODIUM CHLORIDE 0.9 % IV BOLUS (SEPSIS)
1000.0000 mL | Freq: Once | INTRAVENOUS | Status: AC
  Administered 2016-09-09: 1000 mL via INTRAVENOUS

## 2016-09-09 NOTE — ED Provider Notes (Signed)
[facility] DEPT [facility] Provider Note   CSN: [id number] Arrival date & time: 09/09/16  1531  By signing my name below, I, Arianna Nassar, attest that this documentation has been prepared under the direction and in the presence of Fatima Blank, MD.  Electronically Signed: Julien Nordmann, ED Scribe. 09/09/16. 4:37 PM.    History   Chief Complaint Chief Complaint  Patient presents with  . Foot Pain   The history is provided by the patient. No language interpreter was used.   HPI Comments: Scott Frye is a 55 y.o. male who has a PMhx of chronic foot pain, compartment syndrome to the LLE, phlegmasia cerelea dolens, thrombocytosis, and acute osteomyelitis of left foot presents to the Emergency Department complaining of acute onset, gradual worsening, left foot pain that started about 4 days ago. He has been having associated left leg and left foot swelling for the past week. Pt has a chronic, intermittent wound to the plantar surface of his left foot that has been more swollen and draining fluid for the past 4 days. Per chart review, pt has a hx of acute osteomyelitis in his left lower extremity that led to admission on 05/31/13. He has a further surgical hx of fasciotomy performed to his left lower extremity due to having compartment syndrome that led to DVT. Pt is not currently on any anticoagulants. Denies shortness of breath, chest pain, fever, nausea, vomiting, or chills.  Past Medical History:  Diagnosis Date  . Compartment syndrome of left lower extremity ([place])   . Phlegmasia cerulea dolens [facility])     Patient Active Problem List   Diagnosis Date Noted  . Leukocytopenia, unspecified 06/03/2013  . Thrombocytosis ([place]) 06/03/2013  . Acute osteomyelitis of left foot ([place]) 05/31/2013    Past Surgical History:  Procedure Laterality Date  . AMPUTATION Left 06/01/2013   Procedure: Irrigation and debridiment left foot and partial 5th amputation;  Surgeon: Gearlean Alf, MD;   Location: [facility]S;  Service: Orthopedics;  Laterality: Left;  . FASCIOTOMY     left leg secondary to compartment syndrome form DVT  . FOOT SURGERY    . TONSILLECTOMY         Home Medications    Prior to Admission medications   Medication Sig Start Date End Date Taking? Authorizing Provider  clindamycin (CLEOCIN) 150 MG capsule Take 3 capsules (450 mg total) by mouth 3 (three) times daily. 09/09/16 09/16/16  Fatima Blank, MD  doxycycline (VIBRA-TABS) 100 MG tablet Take 1 tablet (100 mg total) by mouth every 12 (twelve) hours. 06/04/13   Janece Canterbury, MD  doxycycline (VIBRAMYCIN) 100 MG capsule Take 1 capsule (100 mg total) by mouth 2 (two) times daily. One po bid x 7 days 04/18/16   Charlesetta Shanks, MD  HYDROcodone-acetaminophen (NORCO/VICODIN) 5-325 MG tablet Take 1-2 tablets by mouth every 4 (four) hours as needed for moderate pain or severe pain. 04/18/16   Charlesetta Shanks, MD  oxyCODONE (ROXICODONE) 15 MG immediate release tablet Take 1 tablet (15 mg total) by mouth every 4 (four) hours as needed. 06/04/13   Janece Canterbury, MD    Family History History reviewed. No pertinent family history.  Social History Social History  Substance Use Topics  . Smoking status: Current Every Day Smoker  . Smokeless tobacco: Not on file  . Alcohol use No     Allergies   Patient has no known allergies.   Review of Systems Review of Systems  All other systems reviewed and are negative.  A complete 10 system review of systems was obtained and all systems are negative except as noted in the HPI and PMH.    Physical Exam Updated Vital Signs BP 114/68 (BP Location: Right Arm)   Pulse 78   Temp 98.1 F (36.7 C)   Resp 14   Ht 6\' 2"  (1.88 m)   Wt 170 lb (77.1 kg)   SpO2 100%   BMI 21.83 kg/m   Physical Exam  Constitutional: He is oriented to person, place, and time. He appears well-developed and well-nourished. No distress.  HENT:  Head: Normocephalic and atraumatic.    Nose: Nose normal.  Eyes: Conjunctivae and EOM are normal. Pupils are equal, round, and reactive to light. Right eye exhibits no discharge. Left eye exhibits no discharge. No scleral icterus.  Neck: Normal range of motion. Neck supple.  Cardiovascular: Normal rate and regular rhythm.  Exam reveals no gallop and no friction rub.   No murmur heard. Pulmonary/Chest: Effort normal and breath sounds normal. No stridor. No respiratory distress. He has no rales.  Abdominal: Soft. He exhibits no distension. There is no tenderness.  Musculoskeletal: He exhibits edema and tenderness.  Grade 2 decubitus ulcer lateral aspect of left foot and plantar aspect of the left 2nd toe Swelling of lower extremity with mild hyperemia, TTP of the entire left foot and distal left leg up to the shin, remote fascioty wound bilateral left lower leg, chronic skin changes of left foot, pulses intact equal and bilaterally  Neurological: He is alert and oriented to person, place, and time.  Skin: Skin is warm and dry. No rash noted. He is not diaphoretic. There is erythema.  Psychiatric: He has a normal mood and affect.  Vitals reviewed.    ED Treatments / Results  DIAGNOSTIC STUDIES: Oxygen Saturation is 100% on RA, normal by my interpretation.  COORDINATION OF CARE:  4:37 PM Discussed treatment plan with pt at bedside and pt agreed to plan.  Labs (all labs ordered are listed, but only abnormal results are displayed) Labs Reviewed  CBC WITH DIFFERENTIAL/PLATELET - Abnormal; Notable for the following:       Result Value   RBC 4.17 (*)    Hemoglobin 11.7 (*)    HCT 36.1 (*)    All other components within normal limits  BASIC METABOLIC PANEL - Abnormal; Notable for the following:    Chloride 98 (*)    Calcium 8.7 (*)    All other components within normal limits  SEDIMENTATION RATE - Abnormal; Notable for the following:    Sed Rate 45 (*)    All other components within normal limits  C-REACTIVE PROTEIN     EKG  EKG Interpretation None       Radiology US Venous Img Lower Unilateral Left  Result Date: 09/09/2016 CLINICAL DATA:  Left lower extremity pain and swelling. EXAM: Left LOWER EXTREMITY VENOUS DOPPLER ULTRASOUND TECHNIQUE: Gray-scale sonography with graded compression, as well as color Doppler and duplex ultrasound were performed to evaluate the lower extremity deep venous systems from the level of the common femoral vein and including the common femoral, femoral, profunda femoral, popliteal and calf veins including the posterior tibial, peroneal and gastrocnemius veins when visible. The superficial great saphenous vein was also interrogated. Spectral Doppler was utilized to evaluate flow at rest and with distal augmentation maneuvers in the common femoral, femoral and popliteal veins. COMPARISON:  None. FINDINGS: Contralateral Common Femoral Vein: Respiratory phasicity is normal and symmetric with the symptomatic side. No evidence  of thrombus. Normal compressibility. Common Femoral Vein: No evidence of thrombus. Normal compressibility, respiratory phasicity and response to augmentation. Saphenofemoral Junction: No evidence of thrombus. Normal compressibility and flow on color Doppler imaging. Profunda Femoral Vein: No evidence of thrombus. Normal compressibility and flow on color Doppler imaging. Femoral Vein: No evidence of thrombus. Normal compressibility, respiratory phasicity and response to augmentation. Popliteal Vein: No evidence of thrombus. Normal compressibility, respiratory phasicity and response to augmentation. Calf Veins: No evidence of thrombus. Normal compressibility and flow on color Doppler imaging. Superficial Great Saphenous Vein: No evidence of thrombus. Normal compressibility and flow on color Doppler imaging. Venous Reflux:  None. Other Findings: Mild edema is noted in the soft tissues of the calf and ankle. IMPRESSION: No evidence of deep venous thrombosis. Electronically  Signed   By: Marijo Sanes M.D.   On: 09/09/2016 18:47    Procedures Procedures (including critical care time)  Medications Ordered in ED Medications  sodium chloride 0.9 % bolus 1,000 mL (0 mLs Intravenous Stopped 09/09/16 1800)    And  0.9 %  sodium chloride infusion ( Intravenous Stopped 09/09/16 1932)  morphine 4 MG/ML injection 4 mg (4 mg Intravenous Given 09/09/16 1619)     Initial Impression / Assessment and Plan / ED Course  I have reviewed the triage vital signs and the nursing notes.  Pertinent labs & imaging results that were available during my care of the patient were reviewed by me and considered in my medical decision making (see chart for details).  Clinical Course     Ultrasound ruling out DVT. Concerning for cellulitis. No subcutaneous emphysema on exam. Low suspicion for necrotizing fasciitis. Would like to give patients first dose of clindamycin IV however patient is requesting to leave. Labs are reassuring. Feel outpatient management is appropriate.  Safe for discharge with strict return precautions.  Final Clinical Impressions(s) / ED Diagnoses   Final diagnoses:  Cellulitis of left lower extremity   I personally performed the services described in this documentation, which was scribed in my presence. The recorded information has been reviewed and is accurate.   Disposition: Discharge  Condition: Good  I have discussed the results, Dx and Tx plan with the patient who expressed understanding and agree(s) with the plan. Discharge instructions discussed at great length. The patient was given strict return precautions who verbalized understanding of the instructions. No further questions at time of discharge.    Discharge Medication List as of 09/09/2016  7:33 PM    START taking these medications   Details  clindamycin (CLEOCIN) 150 MG capsule Take 3 capsules (450 mg total) by mouth 3 (three) times daily., Starting Sun 09/09/2016, Until Sun 09/16/2016, Print          Follow Up: Harvie Junior, MD [place] [id number] [phone]  Schedule an appointment as soon as possible for a visit  in 3-5 days, If symptoms do not improve or  worsen      Fatima Blank, MD 09/09/16 1940

## 2016-09-09 NOTE — ED Triage Notes (Signed)
Pt in c/o chronic foot pain but acute pain and swelling within the last 3-4 days, with wound on plantar surface. Pt alert, interactive, ambulatory in NAD.

## 2016-09-09 NOTE — ED Notes (Signed)
Pt began walking out of unit, stating he doesn't want to wait any longer. Doctor spoke with pt in hallway and pt agrees to wait for antibiotic prescription and paperwork. Pt ambulatory and waiting at nurse's station for paperwork. IV has been removed by pt.

## 2016-11-18 IMAGING — CR DG FOOT COMPLETE 3+V*L*
3 series · 3 of 3 positions shown · non-contrast
Comparison: 07/13/2015 left foot radiographs

CLINICAL DATA: Left foot pain.  History of osteomyelitis.

EXAM:
LEFT FOOT - COMPLETE 3+ VIEW

[t foot ap left]
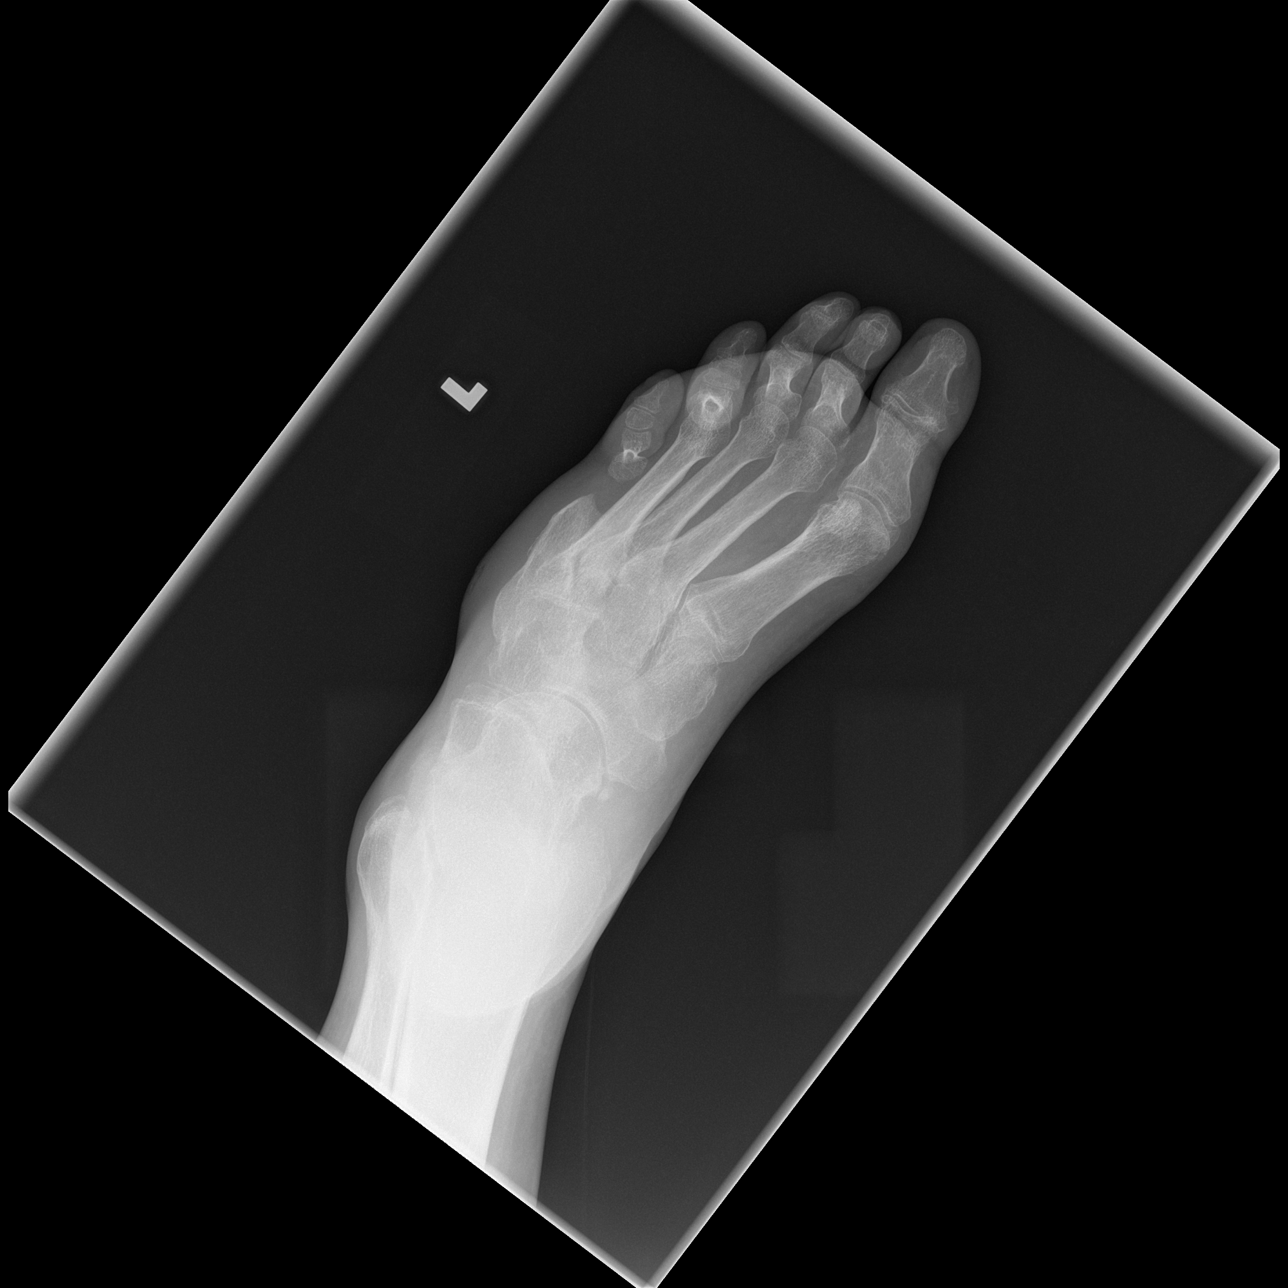

[t foot oblique left]
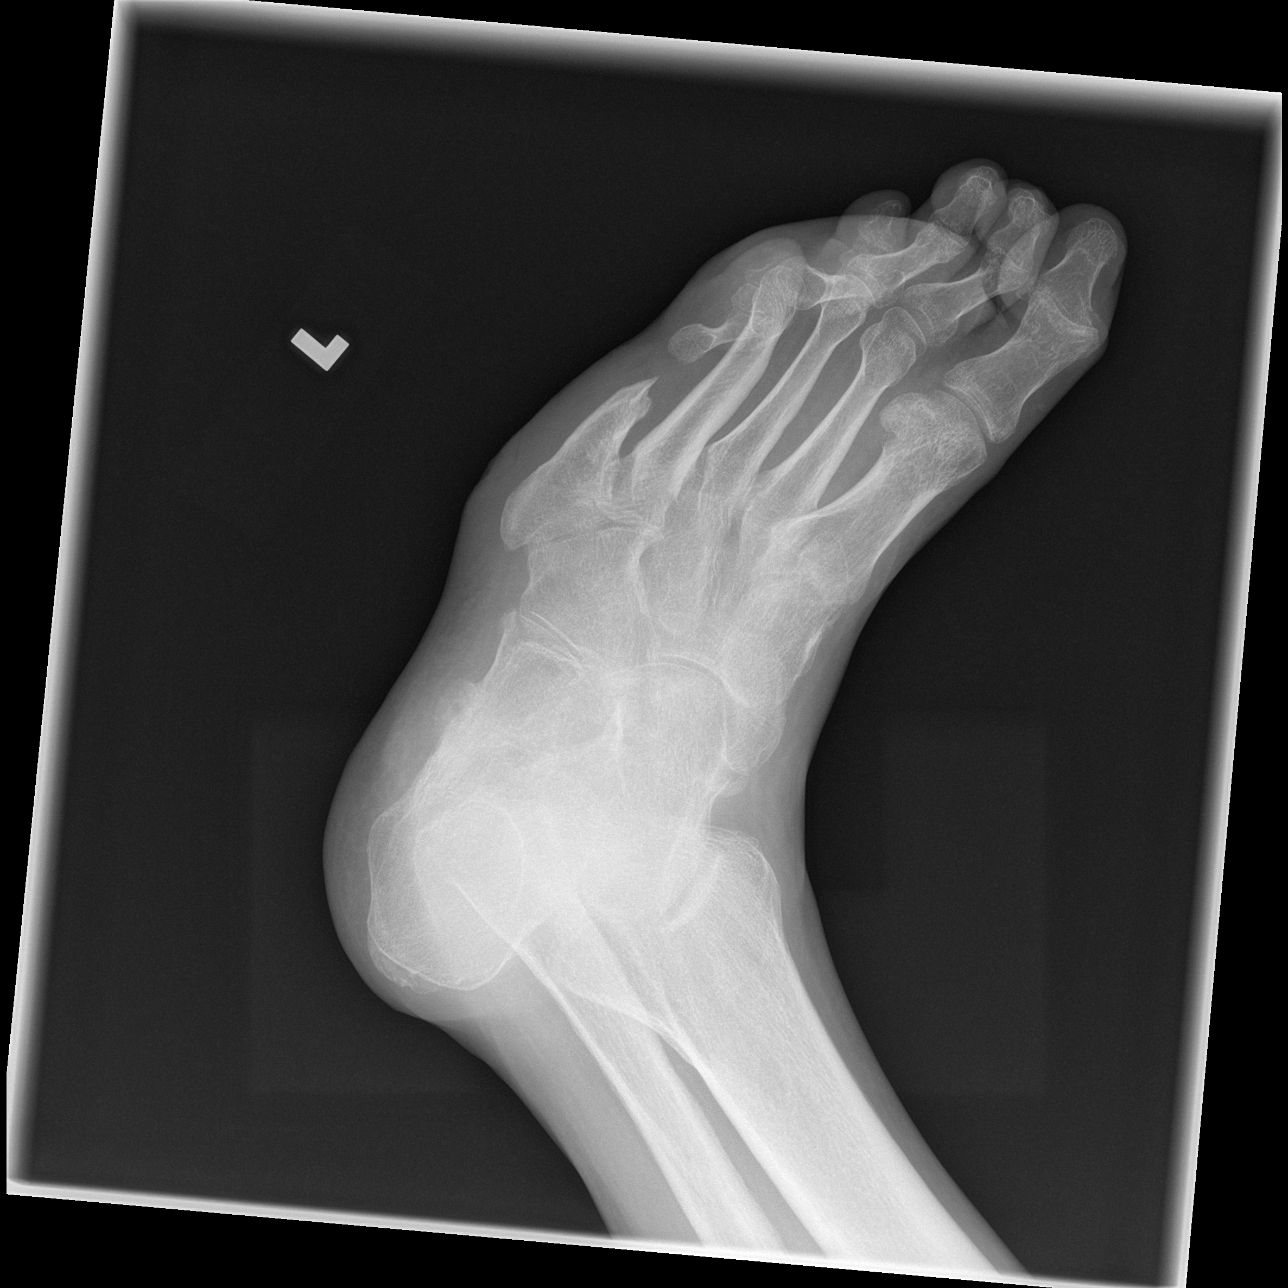

[t foot lat left]
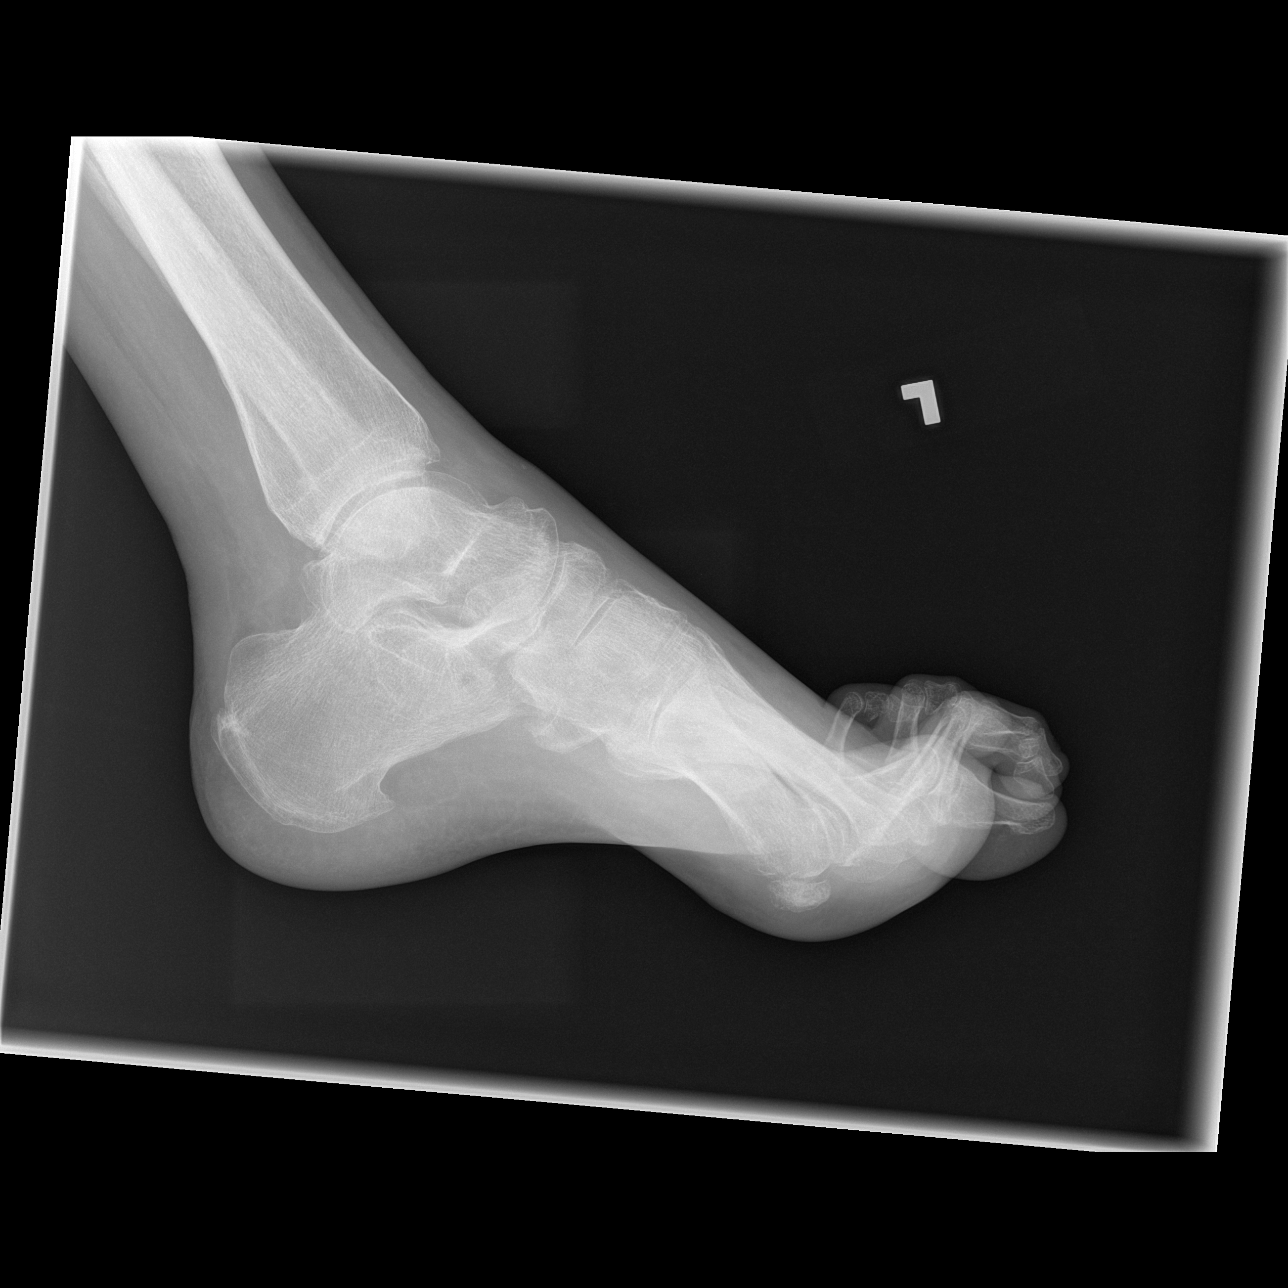

[3 of 3 positions shown; findings below may reference images not displayed]

FINDINGS: Stable partial distal left fifth metatarsal resection. Soft tissue
swelling and ulcer lateral to the proximal left fifth metatarsal.
Stable periosteal reaction at the lateral proximal left fifth
metatarsal shaft. No new sites of periosteal reaction. No cortical
erosions. No appreciable erosive arthropathy. No fracture or
dislocation. No suspicious focal osseous lesion. Small Achilles and
plantar left calcaneal spurs.
IMPRESSION: Soft tissue swelling and ulcer lateral to the proximal left fifth
metatarsal. Periosteal reaction at the lateral proximal left fifth
metatarsal shaft, not appreciably changed since 07/13/2015,
consistent with chronic osteomyelitis. No specific radiographic
findings of acute osteomyelitis.

## 2017-04-24 DIAGNOSIS — Z8673 Personal history of transient ischemic attack (TIA), and cerebral infarction without residual deficits: Secondary | ICD-10-CM | POA: Diagnosis not present

## 2017-04-24 DIAGNOSIS — S52252B Displaced comminuted fracture of shaft of ulna, left arm, initial encounter for open fracture type I or II: Secondary | ICD-10-CM | POA: Diagnosis not present

## 2017-04-24 DIAGNOSIS — M65812 Other synovitis and tenosynovitis, left shoulder: Secondary | ICD-10-CM | POA: Diagnosis not present

## 2017-04-24 DIAGNOSIS — S52602A Unspecified fracture of lower end of left ulna, initial encounter for closed fracture: Secondary | ICD-10-CM | POA: Diagnosis not present

## 2017-04-24 DIAGNOSIS — Z01818 Encounter for other preprocedural examination: Secondary | ICD-10-CM | POA: Diagnosis not present

## 2017-04-24 DIAGNOSIS — W208XXA Other cause of strike by thrown, projected or falling object, initial encounter: Secondary | ICD-10-CM | POA: Diagnosis not present

## 2017-04-24 DIAGNOSIS — R69 Illness, unspecified: Secondary | ICD-10-CM | POA: Diagnosis not present

## 2017-04-24 DIAGNOSIS — S52202B Unspecified fracture of shaft of left ulna, initial encounter for open fracture type I or II: Secondary | ICD-10-CM | POA: Diagnosis not present

## 2017-05-06 DIAGNOSIS — S52252B Displaced comminuted fracture of shaft of ulna, left arm, initial encounter for open fracture type I or II: Secondary | ICD-10-CM | POA: Diagnosis not present

## 2017-05-20 DIAGNOSIS — S52252B Displaced comminuted fracture of shaft of ulna, left arm, initial encounter for open fracture type I or II: Secondary | ICD-10-CM | POA: Diagnosis not present

## 2017-05-28 DIAGNOSIS — S52252B Displaced comminuted fracture of shaft of ulna, left arm, initial encounter for open fracture type I or II: Secondary | ICD-10-CM | POA: Diagnosis not present

## 2017-06-21 IMAGING — US US EXTREM LOW VENOUS*L*
1 series · 13 of 24 positions shown · non-contrast
Comparison: None.

CLINICAL DATA: Left lower extremity pain and swelling.



[Series 1: us extrem low venous*left* · 0.07mm/px · 13 of 31 slices shown]
[im 1/31]
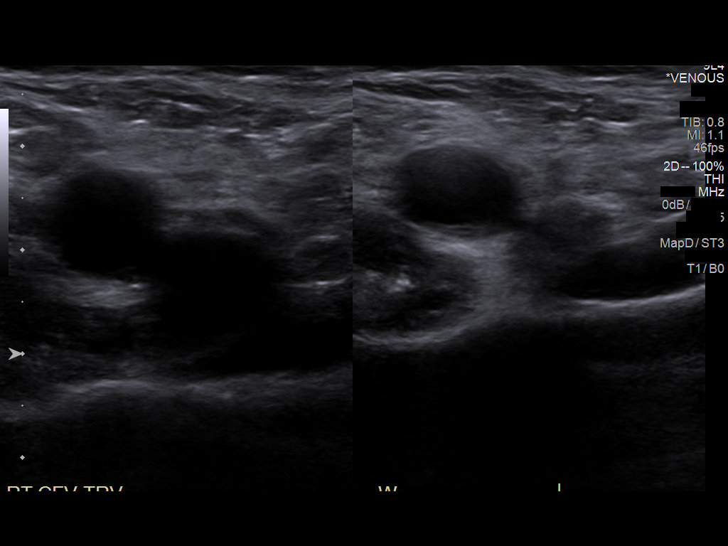
[im 3/31]
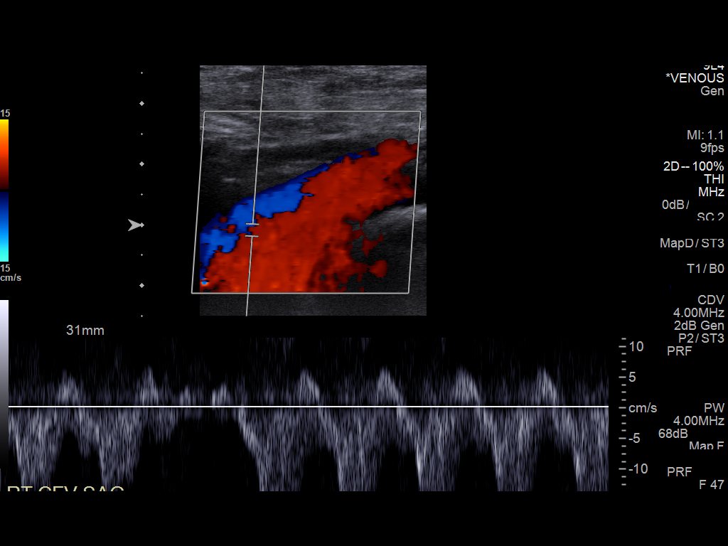
[im 6/31]
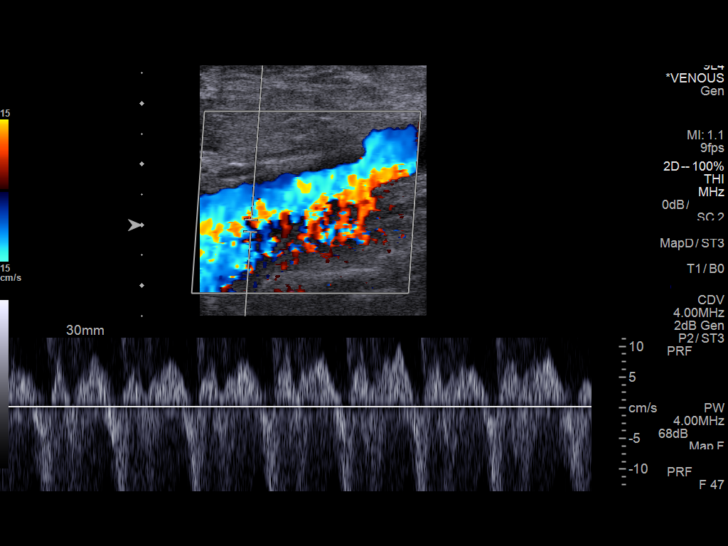
[im 8/31]
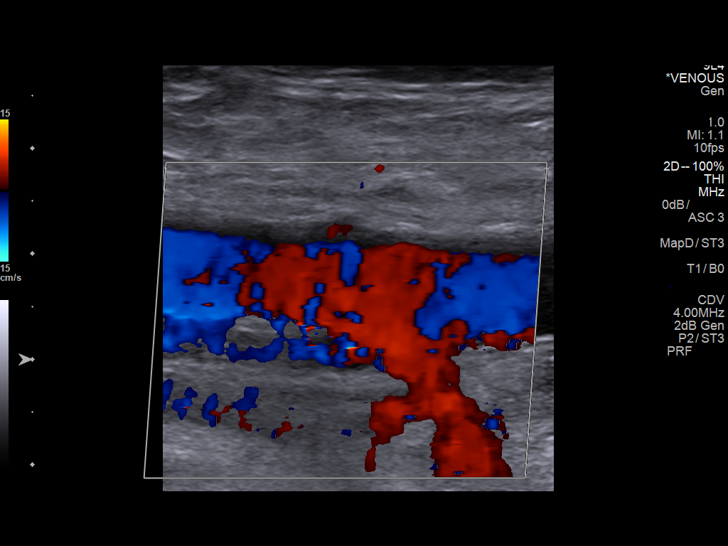
[im 11/31]
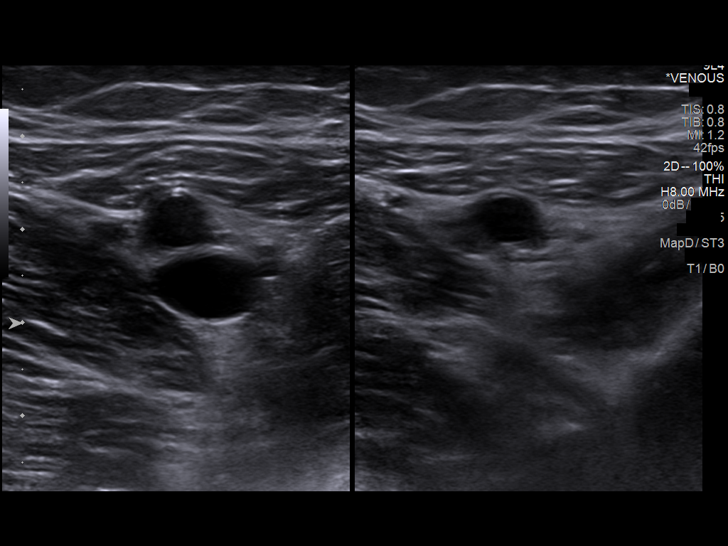
[im 14/31]
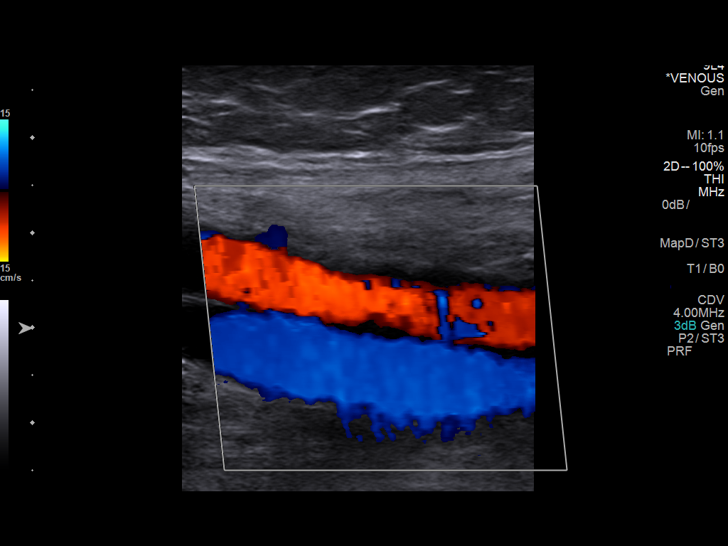
[im 16/31]
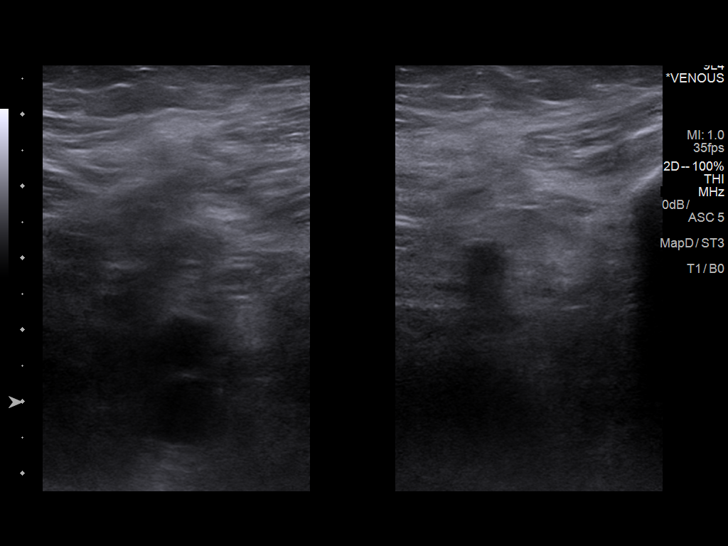
[im 17/31]
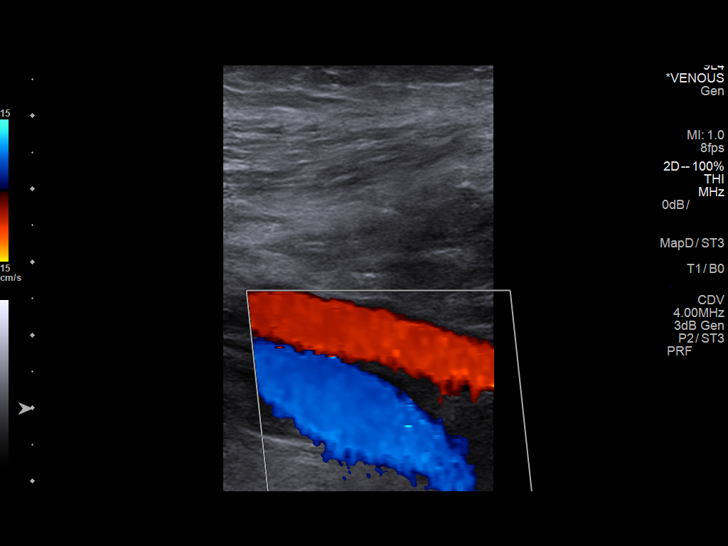
[im 20/31]
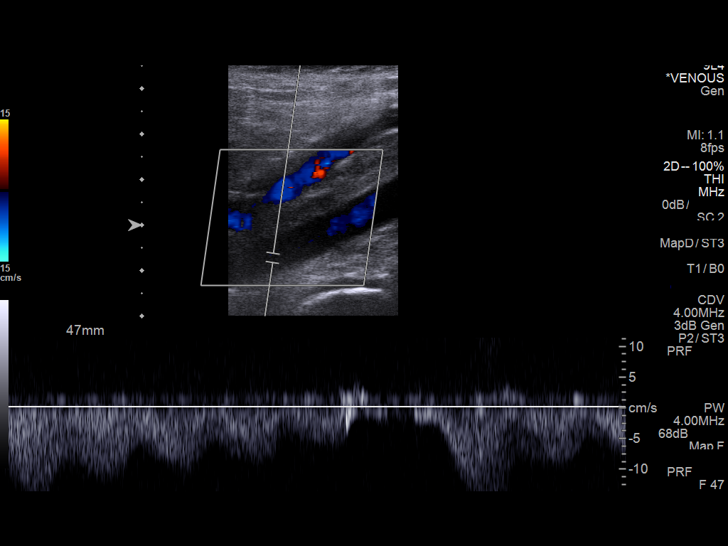
[im 23/31]
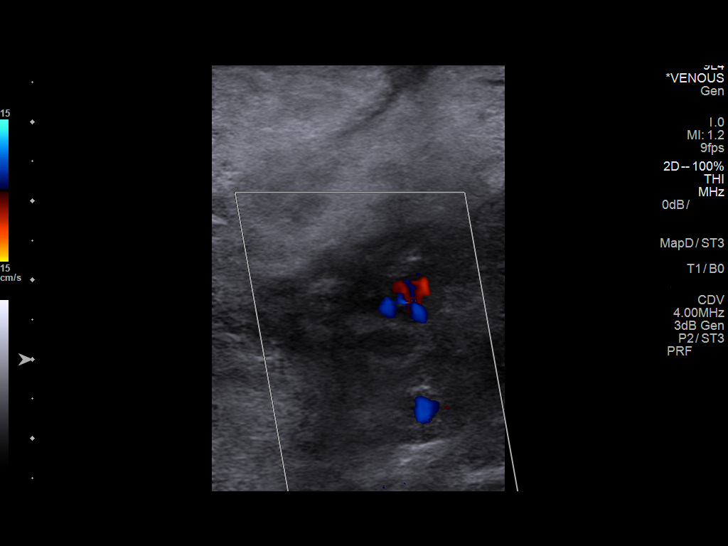
[im 25/31]
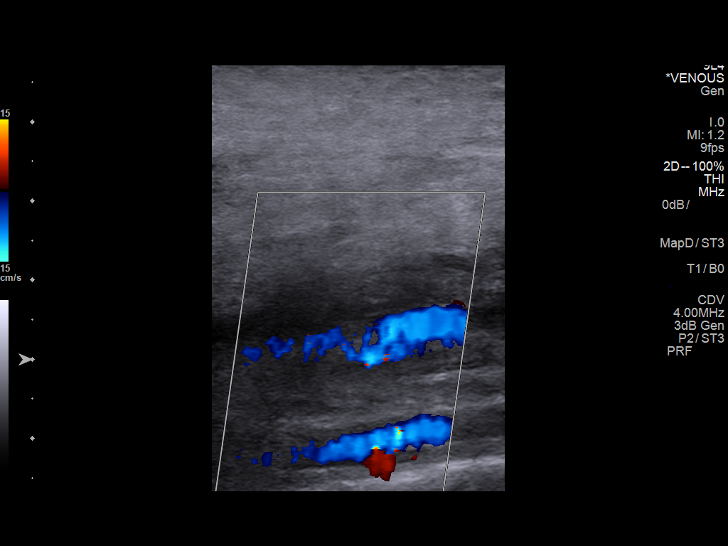
[im 28/31]
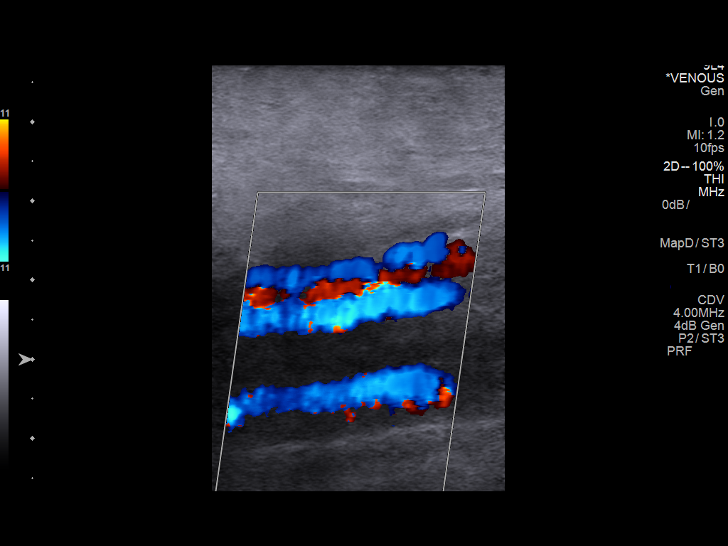
[im 31/31]
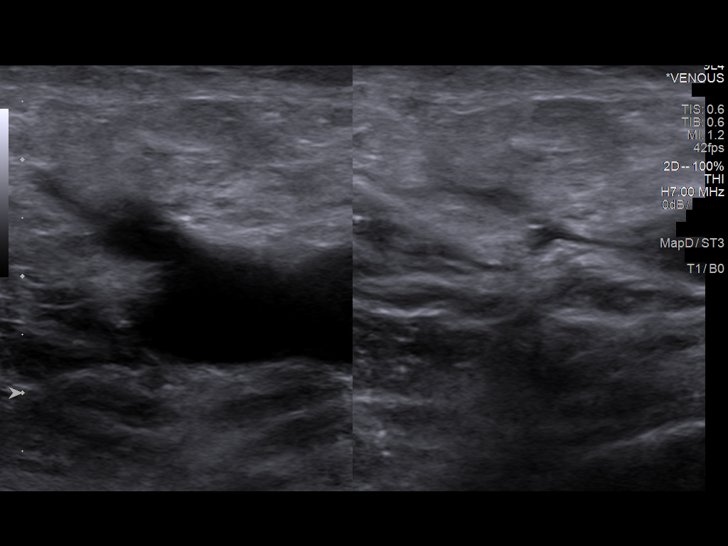

[13 of 24 positions shown; findings below may reference images not displayed]

FINDINGS: Contralateral Common Femoral Vein: Respiratory phasicity is normal
and symmetric with the symptomatic side. No evidence of thrombus.
Normal compressibility.

Common Femoral Vein: No evidence of thrombus. Normal
compressibility, respiratory phasicity and response to augmentation.

Saphenofemoral Junction: No evidence of thrombus. Normal
compressibility and flow on color Doppler imaging.

Profunda Femoral Vein: No evidence of thrombus. Normal
compressibility and flow on color Doppler imaging.

Femoral Vein: No evidence of thrombus. Normal compressibility,
respiratory phasicity and response to augmentation.

Popliteal Vein: No evidence of thrombus. Normal compressibility,
respiratory phasicity and response to augmentation.

Calf Veins: No evidence of thrombus. Normal compressibility and flow
on color Doppler imaging.

Superficial Great Saphenous Vein: No evidence of thrombus. Normal
compressibility and flow on color Doppler imaging.

Venous Reflux:  None.

Other Findings: Mild edema is noted in the soft tissues of the calf
and ankle.
IMPRESSION: No evidence of deep venous thrombosis.

## 2017-07-02 DIAGNOSIS — S52252B Displaced comminuted fracture of shaft of ulna, left arm, initial encounter for open fracture type I or II: Secondary | ICD-10-CM | POA: Diagnosis not present

## 2017-07-26 DIAGNOSIS — S52252B Displaced comminuted fracture of shaft of ulna, left arm, initial encounter for open fracture type I or II: Secondary | ICD-10-CM | POA: Diagnosis not present

## 2017-11-11 DIAGNOSIS — S82891A Other fracture of right lower leg, initial encounter for closed fracture: Secondary | ICD-10-CM | POA: Diagnosis not present

## 2018-06-14 DIAGNOSIS — F17210 Nicotine dependence, cigarettes, uncomplicated: Secondary | ICD-10-CM | POA: Diagnosis not present

## 2018-06-14 DIAGNOSIS — F1010 Alcohol abuse, uncomplicated: Secondary | ICD-10-CM | POA: Diagnosis not present

## 2018-06-14 DIAGNOSIS — F419 Anxiety disorder, unspecified: Secondary | ICD-10-CM | POA: Diagnosis not present

## 2018-06-14 DIAGNOSIS — M79672 Pain in left foot: Secondary | ICD-10-CM | POA: Diagnosis not present

## 2018-06-14 DIAGNOSIS — Z79899 Other long term (current) drug therapy: Secondary | ICD-10-CM | POA: Diagnosis not present

## 2018-06-14 DIAGNOSIS — Z590 Homelessness: Secondary | ICD-10-CM | POA: Diagnosis not present

## 2018-06-14 DIAGNOSIS — Z653 Problems related to other legal circumstances: Secondary | ICD-10-CM | POA: Diagnosis not present

## 2018-06-14 DIAGNOSIS — R69 Illness, unspecified: Secondary | ICD-10-CM | POA: Diagnosis not present

## 2018-06-14 DIAGNOSIS — Z609 Problem related to social environment, unspecified: Secondary | ICD-10-CM | POA: Diagnosis not present

## 2018-06-14 DIAGNOSIS — G894 Chronic pain syndrome: Secondary | ICD-10-CM | POA: Diagnosis not present

## 2018-06-20 DIAGNOSIS — G90522 Complex regional pain syndrome I of left lower limb: Secondary | ICD-10-CM | POA: Diagnosis not present

## 2018-07-08 DIAGNOSIS — M216X2 Other acquired deformities of left foot: Secondary | ICD-10-CM | POA: Diagnosis not present

## 2018-07-08 DIAGNOSIS — M25572 Pain in left ankle and joints of left foot: Secondary | ICD-10-CM | POA: Diagnosis not present

## 2018-07-08 DIAGNOSIS — M79672 Pain in left foot: Secondary | ICD-10-CM | POA: Diagnosis not present

## 2018-12-06 DEATH — deceased
